# Patient Record
Sex: Male | Born: 1965 | Race: White | Hispanic: No | Marital: Married | Smoking: Never smoker
Health system: Southern US, Community
[De-identification: ages and names within clinical notes are randomized; demographics above are authoritative.]

## PROBLEM LIST (undated history)

## (undated) DIAGNOSIS — K219 Gastro-esophageal reflux disease without esophagitis: Secondary | ICD-10-CM

## (undated) DIAGNOSIS — I10 Essential (primary) hypertension: Secondary | ICD-10-CM

## (undated) DIAGNOSIS — H8109 Meniere's disease, unspecified ear: Secondary | ICD-10-CM

## (undated) DIAGNOSIS — E119 Type 2 diabetes mellitus without complications: Secondary | ICD-10-CM

## (undated) DIAGNOSIS — M109 Gout, unspecified: Secondary | ICD-10-CM

## (undated) DIAGNOSIS — R42 Dizziness and giddiness: Secondary | ICD-10-CM

## (undated) HISTORY — PX: TONSILLECTOMY: SUR1361

## (undated) HISTORY — PX: SHOULDER ARTHROSCOPY: SHX128

## (undated) HISTORY — DX: Meniere's disease, unspecified ear: H81.09

## (undated) HISTORY — DX: Dizziness and giddiness: R42

## (undated) HISTORY — PX: CHOLECYSTECTOMY: SHX55

---

## 2005-04-05 ENCOUNTER — Ambulatory Visit (HOSPITAL_COMMUNITY): Admission: RE | Admit: 2005-04-05 | Discharge: 2005-04-05 | Payer: Self-pay | Admitting: Otolaryngology

## 2009-12-26 ENCOUNTER — Encounter: Admission: RE | Admit: 2009-12-26 | Discharge: 2009-12-26 | Payer: Self-pay | Admitting: Orthopedic Surgery

## 2010-12-13 ENCOUNTER — Encounter: Payer: Self-pay | Admitting: Otolaryngology

## 2011-01-31 ENCOUNTER — Observation Stay (HOSPITAL_COMMUNITY)
Admission: EM | Admit: 2011-01-31 | Discharge: 2011-02-01 | Disposition: A | Discharge status: Home / Self Care | Payer: 59 | Attending: Emergency Medicine | Admitting: Emergency Medicine

## 2011-01-31 ENCOUNTER — Emergency Department (HOSPITAL_COMMUNITY): Payer: 59

## 2011-01-31 DIAGNOSIS — R079 Chest pain, unspecified: Principal | ICD-10-CM | POA: Insufficient documentation

## 2011-01-31 LAB — COMPREHENSIVE METABOLIC PANEL
ALT: 20 U/L (ref 0–53)
Albumin: 4 g/dL (ref 3.5–5.2)
BUN: 15 mg/dL (ref 6–23)
GFR calc Af Amer: >60 mL/min (ref >60)
GFR calc non Af Amer: >60 mL/min (ref >60)
Sodium: 140 mEq/L (ref 135–145)
Total Protein: 7.1 g/dL (ref 6.0–8.3)

## 2011-01-31 LAB — POCT CARDIAC MARKERS
CKMB, poc: 1.1 ng/mL (ref 1.0–8.0)
Myoglobin, poc: 94.3 ng/mL (ref 12–200)
Myoglobin, poc: 78.6 ng/mL (ref 12–200)
Troponin i, poc: <0.05 ng/mL (ref 0.00–0.09)
Troponin i, poc: <0.05 ng/mL (ref 0.00–0.09)

## 2011-01-31 LAB — CK TOTAL AND CKMB (NOT AT ARMC)
CK, MB: 2.4 ng/mL (ref 0.3–4.0)
Total CK: 173 U/L (ref 7–232)

## 2011-01-31 LAB — DIFFERENTIAL
Basophils Absolute: 0 10*3/uL (ref 0.0–0.1)
Basophils Relative: 0 % (ref 0–1)
Eosinophils Absolute: 0.2 10*3/uL (ref 0.0–0.7)
Eosinophils Relative: 3 % (ref 0–5)
Monocytes Relative: 10 % (ref 3–12)
Neutrophils Relative %: 53 % (ref 43–77)

## 2011-01-31 LAB — CBC
MCH: 31.6 pg (ref 26.0–34.0)
RDW: 13 % (ref 11.5–15.5)
WBC: 7.3 10*3/uL (ref 4.0–10.5)

## 2011-02-01 DIAGNOSIS — R072 Precordial pain: Secondary | ICD-10-CM

## 2011-03-11 ENCOUNTER — Ambulatory Visit (INDEPENDENT_AMBULATORY_CARE_PROVIDER_SITE_OTHER): Payer: 59 | Admitting: Internal Medicine

## 2011-03-11 DIAGNOSIS — K219 Gastro-esophageal reflux disease without esophagitis: Secondary | ICD-10-CM

## 2011-03-28 NOTE — Consult Note (Signed)
  NAMEYOHANNES, Steve Bernard                  ACCOUNT NO.:  [id number]  MEDICAL RECORD NO.:  [id number]           PATIENT TYPE: AMB.  LOCATION: [place].                   [facility]  PHYSICIAN:  Lionel December, M.D.    DATE OF BIRTH:  [birth year]  DATE :  03/11/2011                                 CONSULTATION   REASON FOR CONSULTATION: Poorly controlled GERD.  HISTORY OF PRESENT ILLNESS:  Steve Bernard is a 45 year old male stating that he has had acid reflux for 8-9 years.  His acid reflux is not controlled completely at this time with the Nexium 40 mg a day.  He has been on either Prilosec or Nexium for the past 8-9 years.  He states approximately 9 or 10 years ago he underwent a cholecystectomy by Dr. Cleotis Nipper.  He says when they intubated him, Dr. Cleotis Nipper was noted to say that his esophagus was very inflamed.  He says his appetite is good. There has been no dysphagia.  He has had a cardiac workup in the last month for chest pain which was negative.  Actually, he underwent an echo stress test which was negative.  His appetite is good.  He has had no dysphagia.  There has been no weight loss, fatigue, or fever.  There are no known allergies.  HOME MEDICATIONS: 1. Nexium 40 mg a day. 2. Metoprolol 25 mg twice a day. 3. MVI 1 a day. 4. Fish oil 1 a day. 5. Red yeast rice 1 a day.  SURGERIES:  He has had a cholecystectomy and a tonsillectomy.  MEDICAL:  He has hypertension, high cholesterol and GERD.  His mother deceased from type 1 diabetes in her 39s.  Father is alive with aortic valve replacement, doing pretty good.  One sister that was a stillborn.  Social, he is married.  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  He does not smoke, drink or do drugs and he has 1 child that is adopted.  OBJECTIVE:  VITAL SIGNS:  His weight is 282, height 5 feet 11 inches, temperature is 98.5, blood pressure is 138/78, pulse is 68. HEENT:  He has natural teeth.  His oral mucosa is  moist.  There are no lesions.  His conjunctivae are pink.  His sclerae are anicteric. NECK:  His thyroid is normal.  There is no cervical lymphadenopathy. LUNGS:  Clear. HEART:  Regular rate and rhythm. ABDOMEN:  Obese.  Bowel sounds are positive.  No masses.  ASSESSMENT:  Steve Bernard is a 45 year old male presenting with symptoms of GERD which are not controlled at this time.  Peptic ulcer disease needs to be ruled out as well as erosive esophagitis.  RECOMMENDATIONS:  We will schedule an EGD in the near future with Dr. Karilyn Cota and the risk and benefits were reviewed with the patient and he is agreeable.    ______________________________ Dorene Ar, NP   ______________________________ Lionel December, M.D.    TS/MEDQ  D:  03/11/2011  T:  03/12/2011  Job:  [id number]  Electronically Signed by Dorene Ar PA on 03/19/2011 10:41:00 AM Electronically Signed by Lionel December M.D. on 03/28/2011 09:43:42 PM

## 2011-04-15 ENCOUNTER — Encounter (INDEPENDENT_AMBULATORY_CARE_PROVIDER_SITE_OTHER): Payer: 59 | Admitting: Internal Medicine

## 2011-04-15 ENCOUNTER — Ambulatory Visit (HOSPITAL_COMMUNITY): Admission: RE | Admit: 2011-04-15 | Payer: 59 | Source: Ambulatory Visit | Admitting: Internal Medicine

## 2011-05-27 ENCOUNTER — Ambulatory Visit (HOSPITAL_COMMUNITY)
Admission: RE | Admit: 2011-05-27 | Discharge: 2011-05-27 | Disposition: A | Discharge status: Home / Self Care | Payer: 59 | Source: Ambulatory Visit | Attending: Internal Medicine | Admitting: Internal Medicine

## 2011-05-27 ENCOUNTER — Encounter (HOSPITAL_BASED_OUTPATIENT_CLINIC_OR_DEPARTMENT_OTHER): Payer: 59 | Admitting: Internal Medicine

## 2011-05-27 DIAGNOSIS — K219 Gastro-esophageal reflux disease without esophagitis: Secondary | ICD-10-CM

## 2011-05-27 DIAGNOSIS — Z79899 Other long term (current) drug therapy: Secondary | ICD-10-CM | POA: Insufficient documentation

## 2011-05-27 DIAGNOSIS — R0789 Other chest pain: Secondary | ICD-10-CM | POA: Insufficient documentation

## 2011-05-27 DIAGNOSIS — I10 Essential (primary) hypertension: Secondary | ICD-10-CM | POA: Insufficient documentation

## 2011-05-27 DIAGNOSIS — R1013 Epigastric pain: Secondary | ICD-10-CM | POA: Insufficient documentation

## 2011-06-14 NOTE — Op Note (Signed)
  [name], Steve Bernard NO.:  [id number]  MEDICAL RECORD NO.:  [id number]  LOCATION:  [place]                          FACILITY:  [facility]  PHYSICIAN:  Lionel December, M.D.    DATE OF BIRTH:  Sep 08, 1966  DATE OF PROCEDURE:  05/27/2011 DATE OF DISCHARGE:                              OPERATIVE REPORT   PROCEDURE:  Esophagogastroduodenoscopy.  INDICATION:  Steve Bernard is 45 year old Caucasian male who has been experiencing recurrent chest pain.  His noninvasive cardiac evaluation was negative.  He has symptoms of GERD for more than 10 years.  He states that since he was switched from Zegerid to Nexium, he is doing much better.  His EGD had to be postponed because of his father's severe illness.  He is undergoing EGD also to make sure he does not have complicated GERD or Barrett's esophagus.  Procedure and risks were reviewed with the patient and informed consent was obtained.  MEDS FOR CONSCIOUS SEDATION:  Cetacaine spray for pharyngeal topical anesthesia, Demerol 50 mg IV, Versed 6 mg IV.  FINDINGS:  Procedure performed in endoscopy suite.  The patient's vital signs and O2 sat were monitored during the procedure and remained stable.  The patient was placed in left lateral recumbent position and Pentax videoscope was passed through oropharynx without any difficulty into esophagus.  Esophagus.  Mucosa of the esophagus was normal.  GE junction was wavy, located at 40 cm from the incisors, but no erosions or ulcers were noted.  Stomach.  It was empty and distended very well by insufflation.  Folds of proximal stomach are normal.  Examination of mucosa at body, antrum, pyloric channel, as well as angularis, fundus, and cardia was normal.  Duodenum.  Bulbar mucosa was normal.  Scope was passed to second part of duodenum where mucosa and folds were normal.  Endoscope was withdrawn.  The patient tolerated the procedure well.  FINAL DIAGNOSES: 1. Normal  esophagogastroduodenoscopy. 2. Since he is doing much better with different PPI, i.e., Nexium, we     will not proceed with further workup.  RECOMMENDATIONS: 1. He will continue antireflux measures and Nexium as before. 2. Should the chest pain recur, he will give [place] a call.          ______________________________ Lionel December, M.D.     NR/MEDQ  D:  05/27/2011  T:  05/27/2011  Job:  [id number]  cc:   Doreen Beam, MD Fax: [phone]  Electronically Signed by Lionel December M.D. on 06/14/2011 12:10:29 AM

## 2012-07-10 IMAGING — CR DG CHEST 1V PORT
1 series · 1 of 1 positions shown · non-contrast
Comparison: None

CLINICAL DATA: Chest pain

PORTABLE CHEST - 1 VIEW

[view not recorded]
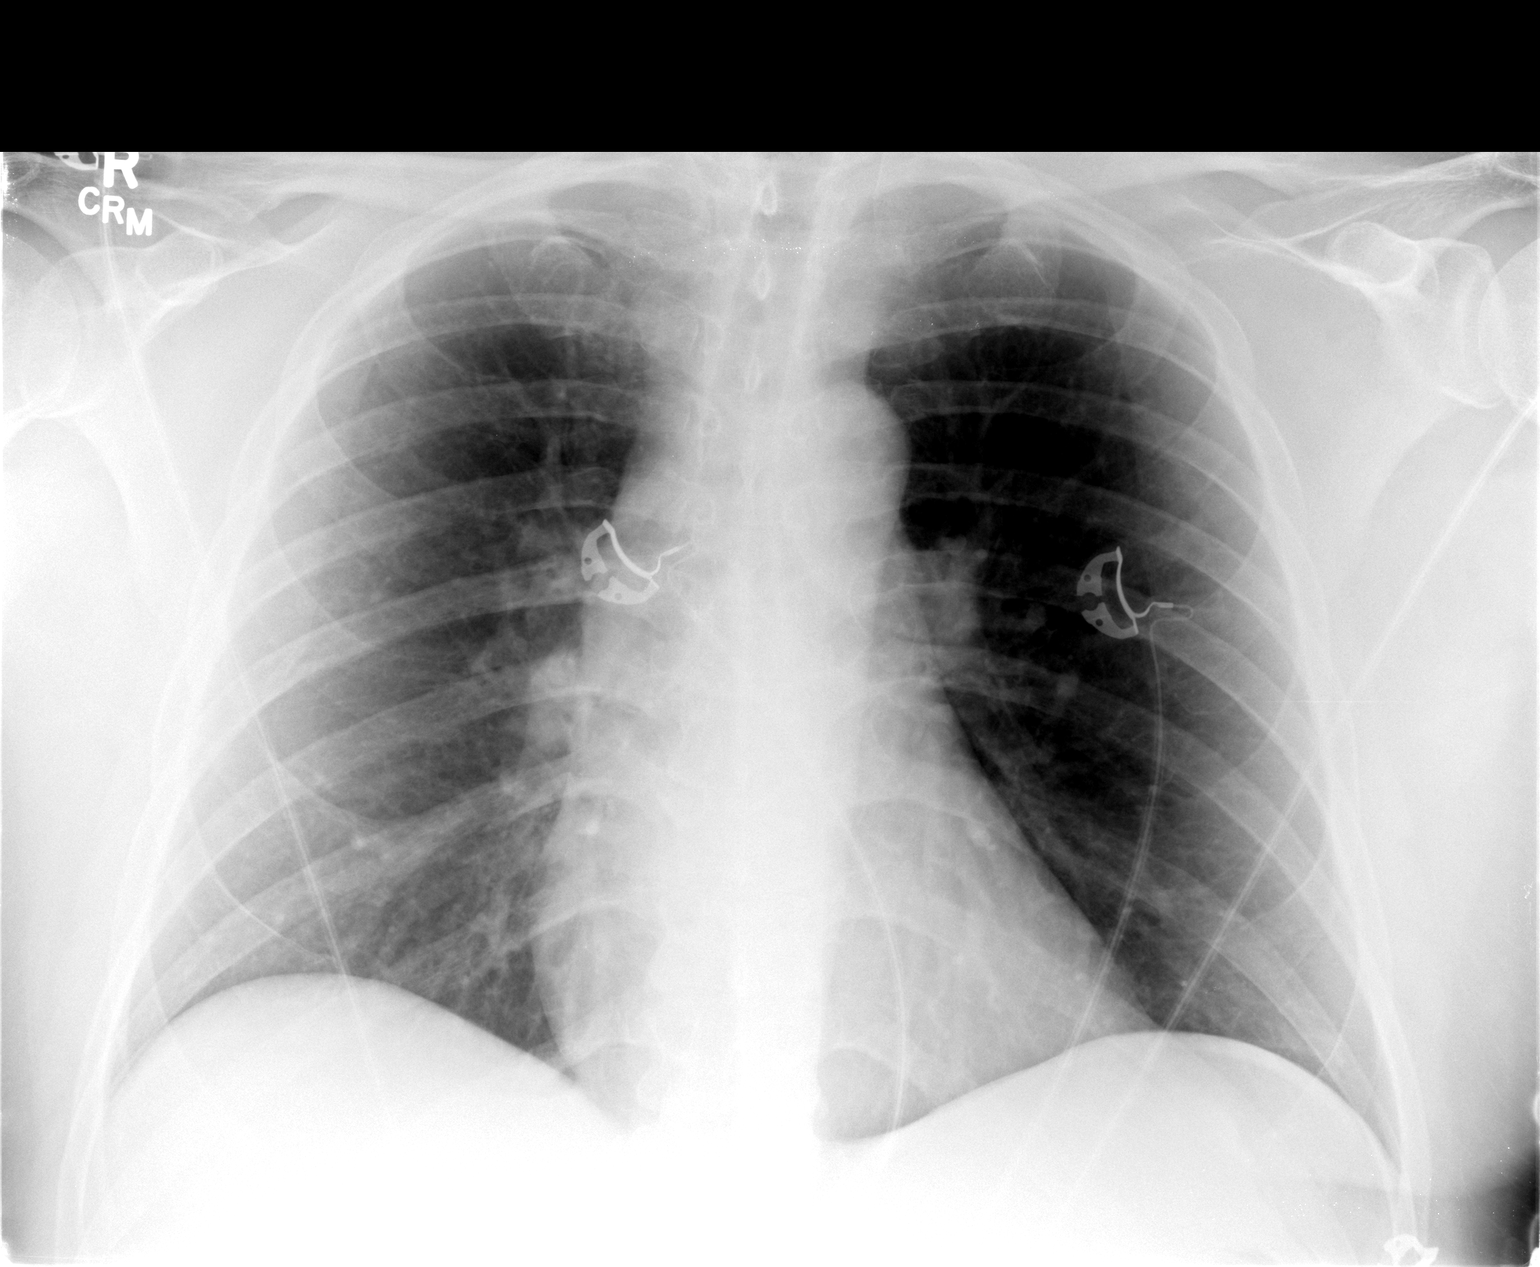

[1 of 1 positions shown; findings below may reference images not displayed]

FINDINGS: The cardiomediastinal silhouette is unremarkable.
The lungs are clear.
There is no evidence of focal airspace disease, pulmonary edema,
pulmonary nodule/mass, pleural effusion, or pneumothorax.
No acute bony abnormalities are identified.
IMPRESSION: No evidence of active cardiopulmonary disease.

## 2013-01-11 ENCOUNTER — Encounter (INDEPENDENT_AMBULATORY_CARE_PROVIDER_SITE_OTHER): Payer: Self-pay | Admitting: *Deleted

## 2013-01-17 ENCOUNTER — Telehealth (INDEPENDENT_AMBULATORY_CARE_PROVIDER_SITE_OTHER): Payer: Self-pay | Admitting: *Deleted

## 2013-01-17 ENCOUNTER — Other Ambulatory Visit (INDEPENDENT_AMBULATORY_CARE_PROVIDER_SITE_OTHER): Payer: Self-pay | Admitting: *Deleted

## 2013-01-17 ENCOUNTER — Encounter (INDEPENDENT_AMBULATORY_CARE_PROVIDER_SITE_OTHER): Payer: Self-pay | Admitting: *Deleted

## 2013-01-17 DIAGNOSIS — Z1211 Encounter for screening for malignant neoplasm of colon: Secondary | ICD-10-CM

## 2013-01-17 NOTE — Telephone Encounter (Signed)
Patient needs movi prep 

## 2013-01-18 ENCOUNTER — Encounter (INDEPENDENT_AMBULATORY_CARE_PROVIDER_SITE_OTHER): Payer: Self-pay | Admitting: *Deleted

## 2013-01-18 MED ORDER — PEG-KCL-NACL-NASULF-NA ASC-C 100 G PO SOLR: 1.0000 | Freq: Once | ORAL | Status: DC

## 2013-02-19 ENCOUNTER — Telehealth (INDEPENDENT_AMBULATORY_CARE_PROVIDER_SITE_OTHER): Payer: Self-pay | Admitting: *Deleted

## 2013-02-19 NOTE — Telephone Encounter (Signed)
  Procedure: tcs  Reason/Indication:  screening  Has patient had this procedure before?  no  If so, when, by whom and where?    Is there a family history of colon cancer?  Not sure  Who?  What age when diagnosed?    Is patient diabetic?   no      Does patient have prosthetic heart valve?  no  Do you have a pacemaker?  no  Has patient ever had endocarditis?   Has patient had joint replacement within last 12 months?  no  Is patient on Coumadin, Plavix and/or Aspirin? no  Medications: allopurinol 300 mg daily, nexium 40 mg daily, metoprolol 50 mg daily  Allergies: nkda  Medication Adjustment:   Procedure date & time: 03/21/13 at 730

## 2013-02-20 NOTE — Telephone Encounter (Signed)
May need to ask Dr. Karilyn Cota concerning this one. Patient is 32. Is he having any problems? Is there a family hx of colon cancer

## 2013-02-22 NOTE — Telephone Encounter (Signed)
This patient is sch'd for TCS 03/21/13, referral from Dr Sherril Croon -- Camelia Eng wanted me to ask you if it's ok to proceed with TCS since patient is only 81, patient wasn't sure if he has family hx of colon ca -- please advise

## 2013-02-25 NOTE — Telephone Encounter (Signed)
Need more information; If he is [place] American it would be reasonable to start screening prior to age 47.

## 2013-02-26 NOTE — Telephone Encounter (Signed)
I spoke to Steve Bernard, he does not have a family hx of colon ca and the only issue he is having is once or twice a month his stool will be "skinny" and sometimes he has a hard time having a bowel movement.  Also, when he had his physical he stated Dr Sherril Croon had a hard time checking his prostate so Dr Sherril Croon felt he needed to go ahead with TCS. I told him I would run this by you and see what your recommendation would be.. -- please advise

## 2013-03-05 NOTE — Telephone Encounter (Signed)
lmom advising patient, ok to proceed with TCS

## 2013-03-05 NOTE — Telephone Encounter (Signed)
Can proceed with colonoscopy for change in bowel habits

## 2013-03-19 ENCOUNTER — Encounter (HOSPITAL_COMMUNITY): Payer: Self-pay | Admitting: Pharmacy Technician

## 2013-03-21 ENCOUNTER — Encounter (HOSPITAL_COMMUNITY)
Admission: RE | Disposition: A | Discharge status: Home / Self Care | Payer: Self-pay | Source: Ambulatory Visit | Attending: Internal Medicine

## 2013-03-21 ENCOUNTER — Ambulatory Visit (HOSPITAL_COMMUNITY)
Admission: RE | Admit: 2013-03-21 | Discharge: 2013-03-21 | Disposition: A | Discharge status: Home / Self Care | Payer: BC Managed Care – PPO | Source: Ambulatory Visit | Attending: Internal Medicine | Admitting: Internal Medicine

## 2013-03-21 ENCOUNTER — Encounter (HOSPITAL_COMMUNITY): Payer: Self-pay | Admitting: *Deleted

## 2013-03-21 DIAGNOSIS — I10 Essential (primary) hypertension: Secondary | ICD-10-CM | POA: Insufficient documentation

## 2013-03-21 DIAGNOSIS — D126 Benign neoplasm of colon, unspecified: Secondary | ICD-10-CM

## 2013-03-21 DIAGNOSIS — R198 Other specified symptoms and signs involving the digestive system and abdomen: Secondary | ICD-10-CM

## 2013-03-21 DIAGNOSIS — Z1211 Encounter for screening for malignant neoplasm of colon: Secondary | ICD-10-CM

## 2013-03-21 HISTORY — DX: Gout, unspecified: M10.9

## 2013-03-21 HISTORY — DX: Gastro-esophageal reflux disease without esophagitis: K21.9

## 2013-03-21 HISTORY — DX: Essential (primary) hypertension: I10

## 2013-03-21 HISTORY — PX: COLONOSCOPY: SHX5424

## 2013-03-21 SURGERY — COLONOSCOPY
Anesthesia: Moderate Sedation

## 2013-03-21 MED ORDER — SODIUM CHLORIDE 0.45 % IV SOLN
INTRAVENOUS | Status: DC
  Administered 2013-03-21: 07:00:00 via INTRAVENOUS

## 2013-03-21 MED ORDER — MEPERIDINE HCL 50 MG/ML IJ SOLN
INTRAMUSCULAR | Status: AC
  Filled 2013-03-21: qty 1

## 2013-03-21 MED ORDER — MEPERIDINE HCL 50 MG/ML IJ SOLN
INTRAMUSCULAR | Status: DC | PRN
  Administered 2013-03-21 (×2): 25 mg via INTRAVENOUS

## 2013-03-21 MED ORDER — STERILE WATER FOR IRRIGATION IR SOLN
Status: DC | PRN
  Administered 2013-03-21: 08:00:00

## 2013-03-21 MED ORDER — MIDAZOLAM HCL 5 MG/5ML IJ SOLN
INTRAMUSCULAR | Status: DC | PRN
  Administered 2013-03-21 (×2): 2 mg via INTRAVENOUS
  Administered 2013-03-21: 1 mg via INTRAVENOUS

## 2013-03-21 MED ORDER — MIDAZOLAM HCL 5 MG/5ML IJ SOLN
INTRAMUSCULAR | Status: AC
  Filled 2013-03-21: qty 10

## 2013-03-21 NOTE — H&P (Signed)
Steve Bernard is an 47 y.o. male.   Chief Complaint: Patient is here for colonoscopy. HPI: Patient is 47 year old Caucasian male who is in for diagnostic colonoscopy. He has noted change in his bowel habits over the last few months. Caliber has decreased. He denies abdominal pain rectal bleeding anorexia or weight loss. Family history is negative for colorectal carcinoma.  Past Medical History  Diagnosis Date  . GERD (gastroesophageal reflux disease)   . Hypertension   . Gout     Past Surgical History  Procedure Laterality Date  . Cholecystectomy    . Tonsillectomy      History reviewed. No pertinent family history. Social History:  reports that he has never smoked. He does not have any smokeless tobacco history on file. He reports that he does not drink alcohol or use illicit drugs.  Allergies: No Known Allergies  Medications Prior to Admission  Medication Sig Dispense Refill  . allopurinol (ZYLOPRIM) 300 MG tablet Take 300 mg by mouth daily.      Marland Kitchen esomeprazole (NEXIUM) 40 MG capsule Take 40 mg by mouth daily before breakfast.      . fluticasone (FLONASE) 50 MCG/ACT nasal spray Place 2 sprays into the nose daily.      . metoprolol (LOPRESSOR) 50 MG tablet Take 50 mg by mouth daily.      . Misc Natural Products (BLACK CHERRY CONCENTRATE PO) Take 1 tablet by mouth daily.      . naphazoline-pheniramine (NAPHCON-A) 0.025-0.3 % ophthalmic solution Place 1 drop into both eyes 4 (four) times daily as needed (Itchy Eyes).      . peg 3350 powder (MOVIPREP) 100 G SOLR Take 1 kit (100 g total) by mouth once.  1 kit  0    No results found for this or any previous visit (from the past 48 hour(s)). No results found.  ROS  Blood pressure 132/88, pulse 63, temperature 97.9 F (36.6 C), temperature source Oral, resp. rate 18, height 5\' 11"  (1.803 m), weight 260 lb (117.935 kg), SpO2 97.00%. Physical Exam  Constitutional: He appears well-developed and well-nourished.  HENT:  Mouth/Throat:  Oropharynx is clear and moist.  Eyes: Conjunctivae are normal. No scleral icterus.  Neck: No thyromegaly present.  Cardiovascular: Normal rate, regular rhythm and normal heart sounds.   No murmur heard. Respiratory: Effort normal and breath sounds normal.  GI: Soft. He exhibits no distension and no mass. There is no tenderness.  Musculoskeletal: He exhibits no edema.  Lymphadenopathy:    He has no cervical adenopathy.  Neurological: He is alert.  Skin: Skin is warm and dry.     Assessment/Plan Change in bowel habits. Diagnostic colonoscopy.  Steve Bernard U 03/21/2013, 7:37 AM

## 2013-03-21 NOTE — Op Note (Signed)
COLONOSCOPY PROCEDURE REPORT  PATIENT:  Steve Bernard  MR#:  [id number] Birthdate:  Nov 30, 1965, 47 y.o., male Endoscopist:  Dr. Malissa Hippo, MD Referred By:  Dr. Ignatius Specking, MD Procedure Date: 03/21/2013  Procedure:   Colonoscopy with snare polypectomy.  Indications: Patient is 47 year old Caucasian male who has noted change in his bowel habits recently. Family history is negative for CRC.  Informed Consent:  The procedure and risks were reviewed with the patient and informed consent was obtained.  Medications:  Demerol 50 mg IV Versedmg IV  Description of procedure:  After a digital rectal exam was performed, that colonoscope was advanced from the anus through the rectum and colon to the area of the cecum, ileocecal valve and appendiceal orifice. The cecum was deeply intubated. These structures were well-seen and photographed for the record. From the level of the cecum and ileocecal valve, the scope was slowly and cautiously withdrawn. The mucosal surfaces were carefully surveyed utilizing scope tip to flexion to facilitate fold flattening as needed. The scope was pulled down into the rectum where a thorough exam including retroflexion was performed. Terminal ileum was also examined.  Findings:   Prep satisfactory. Normal mucosa of terminal ileum. Polyp at proximal transverse colon was ablated via cold biopsy and submitted together with a small polyp that was all stated from distal sigmoid colon. 7 mm polyp snared from distal sigmoid colon another 5 mm polyp snared from rectosigmoid junction. These polyps were cemented together. Normal rectal mucosa and anal rectal junction.   Therapeutic/Diagnostic Maneuvers Performed:  See above  Complications:  None  Cecal Withdrawal Time:  19 minutes  Impression:  Normal mucosa of terminal ileum. Two small polyps submitted together(1 from transverse colon removed via cold biopsy and the second one cold snared from distal sigmoid  colon). Two polyps(7 and 5 mm each) were snared and submitted together(sigmoid colon and rectosigmoid junction).  Recommendations:  Standard instructions given. I will contact patient with biopsy results and further recommendations.  Ramar Nobrega U  03/21/2013 8:22 AM  CC: Dr. Ignatius Specking., MD & Dr. Bonnetta Barry ref. provider found

## 2013-03-22 ENCOUNTER — Encounter (HOSPITAL_COMMUNITY): Payer: Self-pay | Admitting: Internal Medicine

## 2013-03-26 ENCOUNTER — Encounter (INDEPENDENT_AMBULATORY_CARE_PROVIDER_SITE_OTHER): Payer: Self-pay | Admitting: *Deleted

## 2015-03-23 HISTORY — PX: ANTERIOR FUSION CERVICAL SPINE: SUR626

## 2015-11-23 DIAGNOSIS — H8109 Meniere's disease, unspecified ear: Secondary | ICD-10-CM

## 2015-11-23 DIAGNOSIS — R42 Dizziness and giddiness: Secondary | ICD-10-CM

## 2015-11-23 HISTORY — DX: Meniere's disease, unspecified ear: H81.09

## 2015-11-23 HISTORY — DX: Dizziness and giddiness: R42

## 2016-04-01 ENCOUNTER — Other Ambulatory Visit (HOSPITAL_COMMUNITY): Payer: Self-pay | Admitting: Otolaryngology

## 2016-04-01 DIAGNOSIS — H9311 Tinnitus, right ear: Secondary | ICD-10-CM

## 2016-04-01 DIAGNOSIS — IMO0001 Reserved for inherently not codable concepts without codable children: Secondary | ICD-10-CM

## 2016-04-01 DIAGNOSIS — H903 Sensorineural hearing loss, bilateral: Secondary | ICD-10-CM

## 2016-04-05 ENCOUNTER — Ambulatory Visit (HOSPITAL_COMMUNITY)
Admission: RE | Admit: 2016-04-05 | Discharge: 2016-04-05 | Disposition: A | Discharge status: Home / Self Care | Payer: BLUE CROSS/BLUE SHIELD | Source: Ambulatory Visit | Attending: Otolaryngology | Admitting: Otolaryngology

## 2016-04-05 DIAGNOSIS — H903 Sensorineural hearing loss, bilateral: Secondary | ICD-10-CM

## 2016-04-05 DIAGNOSIS — H9191 Unspecified hearing loss, right ear: Secondary | ICD-10-CM | POA: Diagnosis not present

## 2016-04-05 DIAGNOSIS — R42 Dizziness and giddiness: Secondary | ICD-10-CM | POA: Diagnosis not present

## 2016-04-05 DIAGNOSIS — R9089 Other abnormal findings on diagnostic imaging of central nervous system: Secondary | ICD-10-CM | POA: Insufficient documentation

## 2016-04-05 DIAGNOSIS — IMO0001 Reserved for inherently not codable concepts without codable children: Secondary | ICD-10-CM

## 2016-04-05 DIAGNOSIS — H9311 Tinnitus, right ear: Secondary | ICD-10-CM

## 2016-04-05 DIAGNOSIS — J392 Other diseases of pharynx: Secondary | ICD-10-CM | POA: Diagnosis not present

## 2016-04-05 LAB — POCT I-STAT CREATININE: Creatinine, Ser: 0.9 mg/dL (ref 0.61–1.24)

## 2016-04-05 MED ORDER — GADOBENATE DIMEGLUMINE 529 MG/ML IV SOLN
20.0000 mL | Freq: Once | INTRAVENOUS | Status: AC | PRN
  Administered 2016-04-05: 20 mL via INTRAVENOUS

## 2016-11-03 DIAGNOSIS — M19042 Primary osteoarthritis, left hand: Principal | ICD-10-CM

## 2016-11-03 DIAGNOSIS — M19041 Primary osteoarthritis, right hand: Secondary | ICD-10-CM | POA: Insufficient documentation

## 2016-11-03 DIAGNOSIS — M1A00X0 Idiopathic chronic gout, unspecified site, without tophus (tophi): Secondary | ICD-10-CM | POA: Insufficient documentation

## 2016-11-03 DIAGNOSIS — Z8669 Personal history of other diseases of the nervous system and sense organs: Secondary | ICD-10-CM | POA: Insufficient documentation

## 2016-11-03 NOTE — Progress Notes (Deleted)
   Office Visit Note  Patient: Steve Bernard             Date of Birth: January 24, 1966           MRN: [id number]             PCP: Glenda Chroman, MD Referring: Glenda Chroman, MD Visit Date: 11/05/2016 Occupation: @GUAROCC @    Subjective:  No chief complaint on file.   History of Present Illness: SHYKEIM SOLE is a 50 y.o. male ***   Activities of Daily Living:  Patient reports morning stiffness for *** {minute/hour:19697}.   Patient {ACTIONS;DENIES/REPORTS:21021675::"Denies"} nocturnal pain.  Difficulty dressing/grooming: {ACTIONS;DENIES/REPORTS:21021675::"Denies"} Difficulty climbing stairs: {ACTIONS;DENIES/REPORTS:21021675::"Denies"} Difficulty getting out of chair: {ACTIONS;DENIES/REPORTS:21021675::"Denies"} Difficulty using hands for taps, buttons, cutlery, and/or writing: {ACTIONS;DENIES/REPORTS:21021675::"Denies"}   No Rheumatology ROS completed.   PMFS History:  Patient Active Problem List   Diagnosis Date Noted  . Idiopathic chronic gout, unspecified site, without tophus (tophi) 11/03/2016  . Primary osteoarthritis of both hands 11/03/2016  . History of Meniere's disease 11/03/2016    Past Medical History:  Diagnosis Date  . GERD (gastroesophageal reflux disease)   . Gout   . Hypertension     No family history on file. Past Surgical History:  Procedure Laterality Date  . CHOLECYSTECTOMY    . COLONOSCOPY N/A 03/21/2013   Procedure: COLONOSCOPY;  Surgeon: Rogene Houston, MD;  Location: [facility];  Service: Endoscopy;  Laterality: N/A;  955-rescheduled to 7:30am Ann notified pt  . TONSILLECTOMY     Social History   Social History Narrative  . No narrative on file     Objective: Vital Signs: There were no vitals taken for this visit.   Physical Exam   Musculoskeletal Exam: ***  CDAI Exam: No CDAI exam completed.    Investigation: Findings:  06/21/2016 ANA with titer, sed rate, rheumatoid factor, CCP and P-ANCA CBC with diff normal CMP normal except  for elevated non fasting glucose 160     Imaging: No results found.  Speciality Comments: No specialty comments available.    Procedures:  No procedures performed Allergies: Patient has no known allergies.   Assessment / Plan:     Visit Diagnoses: Primary osteoarthritis of both hands  Idiopathic chronic gout of multiple sites without tophus  History of Meniere's disease    Orders: No orders of the defined types were placed in this encounter.  No orders of the defined types were placed in this encounter.   Face-to-face time spent with patient was *** minutes. 50% of time was spent in counseling and coordination of care.  Follow-Up Instructions: No Follow-up on file.   Amy Littrell, RT

## 2016-11-05 ENCOUNTER — Ambulatory Visit (INDEPENDENT_AMBULATORY_CARE_PROVIDER_SITE_OTHER): Payer: Self-pay

## 2016-11-05 ENCOUNTER — Ambulatory Visit (INDEPENDENT_AMBULATORY_CARE_PROVIDER_SITE_OTHER): Payer: BLUE CROSS/BLUE SHIELD | Admitting: Rheumatology

## 2016-11-05 ENCOUNTER — Encounter: Payer: Self-pay | Admitting: Rheumatology

## 2016-11-05 VITALS — BP 141/81 | HR 66 | Resp 14 | Ht 71.0 in | Wt 293.0 lb

## 2016-11-05 DIAGNOSIS — M19041 Primary osteoarthritis, right hand: Secondary | ICD-10-CM

## 2016-11-05 DIAGNOSIS — Z8669 Personal history of other diseases of the nervous system and sense organs: Secondary | ICD-10-CM | POA: Diagnosis not present

## 2016-11-05 DIAGNOSIS — M1A09X0 Idiopathic chronic gout, multiple sites, without tophus (tophi): Secondary | ICD-10-CM

## 2016-11-05 DIAGNOSIS — M79672 Pain in left foot: Secondary | ICD-10-CM

## 2016-11-05 DIAGNOSIS — M722 Plantar fascial fibromatosis: Secondary | ICD-10-CM | POA: Diagnosis not present

## 2016-11-05 DIAGNOSIS — M19042 Primary osteoarthritis, left hand: Secondary | ICD-10-CM

## 2016-11-05 DIAGNOSIS — M79671 Pain in right foot: Secondary | ICD-10-CM | POA: Diagnosis not present

## 2016-11-05 MED ORDER — LIDOCAINE HCL 1 % IJ SOLN
0.5000 mL | INTRAMUSCULAR | Status: AC | PRN
  Administered 2016-11-05: .5 mL

## 2016-11-05 MED ORDER — TRIAMCINOLONE ACETONIDE 40 MG/ML IJ SUSP
20.0000 mg | INTRAMUSCULAR | Status: AC | PRN
Start: 2016-11-05 — End: 2016-11-05
  Administered 2016-11-05: 20 mg

## 2016-11-05 MED ORDER — ALLOPURINOL 300 MG PO TABS: 300.0000 mg | ORAL_TABLET | Freq: Every day | ORAL | 1 refills | Status: DC

## 2016-11-05 NOTE — Progress Notes (Signed)
Office Visit Note  Patient: Steve Bernard             Date of Birth: Sep 07, 1966           MRN: [id number]             PCP: Glenda Chroman, MD Referring: Glenda Chroman, MD Visit Date: 11/05/2016 Occupation: @GUAROCC @    Subjective:  No chief complaint on file. Follow-up on gout and high-risk prescription and plantar fasciitis.  History of Present Illness: Steve Bernard is a 50 y.o. male   Last seen 06/21/2016 Patient is doing well with gout. No complaint. No flares. Using allopurinol 3 mg every day Has not had to use Indocin since he has not had any flares but has enough available. He would use 25 mg twice a day when necessary  His main complaint today is been having heel pain bilaterally with right worse than left. This is consistent with plantar fasciitis. He rates his pain as 10 on a scale of 0-10 especially in his right foot and he is unable to ambulate well secondary to this pain.    Activities of Daily Living:  Patient reports morning stiffness for 15 minutes.   Patient Reports nocturnal pain.  Difficulty dressing/grooming: Denies Difficulty climbing stairs: Reports Difficulty getting out of chair: Denies Difficulty using hands for taps, buttons, cutlery, and/or writing: Denies   Review of Systems  Constitutional: Negative for fatigue.  HENT: Negative for mouth sores and mouth dryness.   Eyes: Negative for dryness.  Respiratory: Negative for shortness of breath.   Gastrointestinal: Negative for constipation and diarrhea.  Musculoskeletal: Negative for myalgias and myalgias.  Skin: Negative for sensitivity to sunlight.  Neurological: Negative for memory loss.  Psychiatric/Behavioral: Negative for sleep disturbance.    PMFS History:  Patient Active Problem List   Diagnosis Date Noted  . Idiopathic chronic gout, unspecified site, without tophus (tophi) 11/03/2016  . Primary osteoarthritis of both hands 11/03/2016  . History of Meniere's disease 11/03/2016    Past  Medical History:  Diagnosis Date  . GERD (gastroesophageal reflux disease)   . Gout   . Hypertension   . Meniere disease 2017  . Vertigo 2017    Family History  Problem Relation Age of Onset  . Arthritis/Rheumatoid Mother   . Diabetes Mother   . Heart disease Father   . Arthritis/Rheumatoid Maternal Aunt    Past Surgical History:  Procedure Laterality Date  . ANTERIOR FUSION CERVICAL SPINE N/A 03/2015  . CHOLECYSTECTOMY    . COLONOSCOPY N/A 03/21/2013   Procedure: COLONOSCOPY;  Surgeon: Rogene Houston, MD;  Location: [facility];  Service: Endoscopy;  Laterality: N/A;  955-rescheduled to 7:30am Ann notified pt  . TONSILLECTOMY     Social History   Social History Narrative  . No narrative on file     Objective: Vital Signs: BP (!) 141/81 (BP Location: Left Arm, Patient Position: Sitting, Cuff Size: Normal)   Pulse 66   Resp 14   Ht 5\' 11"  (1.803 m)   Wt 293 lb (132.9 kg)   BMI 40.87 kg/m    Physical Exam  Constitutional: He is oriented to person, place, and time. He appears well-developed and well-nourished.  HENT:  Head: Normocephalic and atraumatic.  Eyes: Conjunctivae and EOM are normal. Pupils are equal, round, and reactive to light.  Neck: Normal range of motion. Neck supple.  Cardiovascular: Normal rate, regular rhythm and normal heart sounds.  Exam reveals no gallop and  no friction rub.   No murmur heard. Pulmonary/Chest: Effort normal and breath sounds normal. No respiratory distress. He has no wheezes. He has no rales. He exhibits no tenderness.  Abdominal: Soft. He exhibits no distension and no mass. There is no tenderness. There is no guarding.  Musculoskeletal: Normal range of motion.  Lymphadenopathy:    He has no cervical adenopathy.  Neurological: He is alert and oriented to person, place, and time. He exhibits normal muscle tone. Coordination normal.  Skin: Skin is warm and dry. Capillary refill takes less than 2 seconds. No rash noted.    Psychiatric: He has a normal mood and affect. His behavior is normal. Judgment and thought content normal.  Vitals reviewed.    Musculoskeletal Exam:  Full range of motion of all joints Grip strength is equal and strong bilaterally Fiber myalgia tender points are all absent  CDAI Exam: No CDAI exam completed.  No synovitis on examination  Investigation: No additional findings. Patient had autoimmune workup back in 06/21/2016. This was reviewed with the patient  06/21/2016 ANA with titer, sed rate, rheumatoid factor, CCP and P-ANCA CBC with diff normal CMP normal except for elevated non fasting glucose 160   Imaging: No results found.  Speciality Comments: No specialty comments available.    Procedures:  Foot Inj Date/Time: 11/05/2016 3:32 PM Performed by: Bo Merino Authorized by: Eliezer Lofts   Consent Given by:  Patient Site marked: the procedure site was marked   Timeout: prior to procedure the correct patient, procedure, and site was verified   Indications:  Fasciitis Condition: Plantar Fasciitis   Location: right plantar fascia muscle   Needle Size:  27 G Approach:  Anterior Medications:  0.5 mL lidocaine 1 %; 20 mg triamcinolone acetonide 40 MG/ML  Patient was having severe pain in the right heel of his foot for approximately one month or so. He rates the pain as 10 on a scale of 0-10. Both feet are hurting but the right is worse than left. I attempted to give him the injection and patient jerked his foot when the needle was inserted into his foot. 9 I had asked his wife to hold him still and grip the foot/ankle but she allow the patient to let go because of the force that he used.  There no injuries except the needle had come out and so I asked Dr. Estanislado Pandy to do the injection Y while I held the foot/ankle. I switched the whole 27-gauge needle since I had already used it and the injection failed to her brand-new 1. The site was prepped and cold  spray was used and Dr. Estanislado Pandy injected the patient in the right heel without difficulty. I covered the area with a Band-Aid and patient appreciated the injection and apologize for flinching/jerking his foot during the time of the injection.  Patient felt at least 25-50% better few minutes after the injection.   Allergies: Patient has no known allergies.   Assessment / Plan:     Visit Diagnoses: Primary osteoarthritis of both hands  Idiopathic chronic gout of multiple sites without tophus  History of Meniere's disease  Heel pain, bilateral - Plan: XR Foot 2 Views Right, XR Foot 2 Views Left  Plantar fasciitis, bilateral   He rates the pain as 10 on a scale of 0-10. Both feet are hurting but the right is worse than left. I attempted to give him the injection and patient jerked his foot when the needle was inserted into his foot. 9  I had asked his wife to hold him still and grip the foot/ankle but she allow the patient to let go because of the force that he used.  There no injuries except the needle had come out and so I asked Dr. Estanislado Pandy to do the injection Y while I held the foot/ankle. I switched the whole 27-gauge needle since I had already used it and the injection failed to her brand-new 1. The site was prepped and cold spray was used and Dr. Estanislado Pandy injected the patient in the right heel without difficulty. I covered the area with a Band-Aid and patient appreciated the injection and apologize for flinching/jerking his foot during the time of the injection.  Patient felt at least 25-50% better few minutes after the injection.  Plan: Refill allopurinol 300 mg daily, 90 day supply with a refill  X-ray of bilateral feet 2 views.  Return to clinic as scheduled  I advised patient to go to the shoe market on [address] for shoe inserts. Patient understands and is agreeable. I've given him a handout on plantar fasciitis exercises.  Give the proper gout diet as he already has  been and take the proper medication as he already has been as well as stay hydrated as he has been doing to maintain his gout control.  Orders: Orders Placed This Encounter  Procedures  . Foot Injection  . XR Foot 2 Views Right  . XR Foot 2 Views Left   Meds ordered this encounter  Medications  . allopurinol (ZYLOPRIM) 300 MG tablet    Sig: Take 1 tablet (300 mg total) by mouth daily.    Dispense:  90 tablet    Refill:  1    Order Specific Question:   Supervising Provider    Answer:   Bo Merino [phone]    Face-to-face time spent with patient was 30 minutes. 50% of time was spent in counseling and coordination of care.  Follow-Up Instructions: Return in about 4 months (around 03/06/2017).   Eliezer Lofts, PA-C

## 2016-11-05 NOTE — Patient Instructions (Addendum)
Plantar Fasciitis Plantar fasciitis is a painful foot condition that affects the heel. It occurs when the band of tissue that connects the toes to the heel bone (plantar fascia) becomes irritated. This can happen after exercising too much or doing other repetitive activities (overuse injury). The pain from plantar fasciitis can range from mild irritation to severe pain that makes it difficult for you to walk or move. The pain is usually worse in the morning or after you have been sitting or lying down for a while. CAUSES This condition may be caused by:  Standing for long periods of time.  Wearing shoes that do not fit.  Doing high-impact activities, including running, aerobics, and ballet.  Being overweight.  Having an abnormal way of walking (gait).  Having tight calf muscles.  Having high arches in your feet.  Starting a new athletic activity. SYMPTOMS The main symptom of this condition is heel pain. Other symptoms include:  Pain that gets worse after activity or exercise.  Pain that is worse in the morning or after resting.  Pain that goes away after you walk for a few minutes. DIAGNOSIS This condition may be diagnosed based on your signs and symptoms. Your health care provider will also do a physical exam to check for:  A tender area on the bottom of your foot.  A high arch in your foot.  Pain when you move your foot.  Difficulty moving your foot. You may also need to have imaging studies to confirm the diagnosis. These can include:  X-rays.  Ultrasound.  MRI. TREATMENT  Treatment for plantar fasciitis depends on the severity of the condition. Your treatment may include:  Rest, ice, and over-the-counter pain medicines to manage your pain.  Exercises to stretch your calves and your plantar fascia.  A splint that holds your foot in a stretched, upward position while you sleep (night splint).  Physical therapy to relieve symptoms and prevent problems in the  future.  Cortisone injections to relieve severe pain.  Extracorporeal shock wave therapy (ESWT) to stimulate damaged plantar fascia with electrical impulses. It is often used as a last resort before surgery.  Surgery, if other treatments have not worked after 12 months. HOME CARE INSTRUCTIONS  Take medicines only as directed by your health care provider.  Avoid activities that cause pain.  Roll the bottom of your foot over a bag of ice or a bottle of cold water. Do this for 20 minutes, 3-4 times a day.  Perform simple stretches as directed by your health care provider.  Try wearing athletic shoes with air-sole or gel-sole cushions or soft shoe inserts.  Wear a night splint while sleeping, if directed by your health care provider.  Keep all follow-up appointments with your health care provider. PREVENTION   Do not perform exercises or activities that cause heel pain.  Consider finding low-impact activities if you continue to have problems.  Lose weight if you need to. The best way to prevent plantar fasciitis is to avoid the activities that aggravate your plantar fascia. SEEK MEDICAL CARE IF:  Your symptoms do not go away after treatment with home care measures.  Your pain gets worse.  Your pain affects your ability to move or do your daily activities. This information is not intended to replace advice given to you by your health care provider. Make sure you discuss any questions you have with your health care provider. Document Released: 08/03/2001 Document Revised: 03/01/2016 Document Reviewed: 09/18/2014 Elsevier Interactive Patient Education    2017 Else[employer] Inc[employer]   ===================================== Gout Introduction Gout is painful swelling that can happen in some of your joints. Gout is a type of arthritis. This condition is caused by having too much uric acid in your body. Uric acid is a chemical that is made when your body breaks down substances called purines.  If your body has too much uric acid, sharp crystals can form and build up in your joints. This causes pain and swelling. Gout attacks can happen quickly and be very painful (acute gout). Over time, the attacks can affect more joints and happen more often (chronic gout). Follow these instructions at home: During a Gout Attack  If directed, put ice on the painful area:  Put ice in a plastic bag.  Place a towel between your skin and the bag.  Leave the ice on for 20 minutes, 2-3 times a day.  Rest the joint as much as possible. If the joint is in your leg, you may be given crutches to use.  Raise (elevate) the painful joint above the level of your heart as often as you can.  Drink enough fluids to keep your pee (urine) clear or pale yellow.  Take over-the-counter and prescription medicines only as told by your doctor.  Do not drive or use heavy machinery while taking prescription pain medicine.  Follow instructions from your doctor about what you can or cannot eat and drink.  Return to your normal activities as told by your doctor. Ask your doctor what activities are safe for you. Avoiding Future Gout Attacks  Follow a low-purine diet as told by a specialist (dietitian) or your doctor. Avoid foods and drinks that have a lot of purines, such as:  Liver.  Kidney.  Anchovies.  Asparagus.  Herring.  Mushrooms  Mussels.  Beer.  Limit alcohol intake to no more than 1 drink a day for nonpregnant women and 2 drinks a day for men. One drink equals 12 oz of beer, 5 oz of wine, or 1 oz of hard liquor.  Stay at a healthy weight or lose weight if you are overweight. If you want to lose weight, talk with your doctor. It is important that you do not lose weight too fast.  Start or continue an exercise plan as told by your doctor.  Drink enough fluids to keep your pee clear or pale yellow.  Take over-the-counter and prescription medicines only as told by your doctor.  Keep all  follow-up visits as told by your doctor. This is important. Contact a doctor if:  You have another gout attack.  You still have symptoms of a gout attack after10 days of treatment.  You have problems (side effects) because of your medicines.  You have chills or a fever.  You have burning pain when you pee (urinate).  You have pain in your lower back or belly. Get help right away if:  You have very bad pain.  Your pain cannot be controlled.  You cannot pee. This information is not intended to replace advice given to you by your health care provider. Make sure you discuss any questions you have with your health care provider. Document Released: 08/17/2008 Document Revised: 04/15/2016 Document Reviewed: 08/21/2015  2017 Elsevier

## 2017-03-03 DIAGNOSIS — M722 Plantar fascial fibromatosis: Secondary | ICD-10-CM | POA: Insufficient documentation

## 2017-03-03 DIAGNOSIS — M79671 Pain in right foot: Secondary | ICD-10-CM | POA: Insufficient documentation

## 2017-03-03 DIAGNOSIS — M79672 Pain in left foot: Secondary | ICD-10-CM

## 2017-03-03 NOTE — Progress Notes (Signed)
Office Visit Note  Patient: Steve Bernard             Date of Birth: 21-Jan-1966           MRN: [id number]             PCP: Glenda Chroman, MD Referring: Glenda Chroman, MD Visit Date: 03/09/2017 Occupation: @GUAROCC @    Subjective:  Follow-up   History of Present Illness: Steve Bernard is a 52 y.o. male  Last seen 11/05/2016 Patient states that he did really well with his heels after giving the injection at the last visit in December. Unfortunately, pain returned a few weeks ago. He is not interested in getting another shot of long-term relief of his heel pain from plantar fasciitis.  His gout is doing really well. He is observing proper diet, fluids,    Activities of Daily Living:  Patient reports morning stiffness for 15 minutes.   Patient Reports nocturnal pain.  Difficulty dressing/grooming: Reports Difficulty climbing stairs: Reports Difficulty getting out of chair: Reports Difficulty using hands for taps, buttons, cutlery, and/or writing: Reports   Review of Systems  Constitutional: Negative for fatigue.  HENT: Negative for mouth sores and mouth dryness.   Eyes: Negative for dryness.  Respiratory: Negative for shortness of breath.   Gastrointestinal: Negative for constipation and diarrhea.  Musculoskeletal: Negative for myalgias and myalgias.  Skin: Negative for sensitivity to sunlight.  Neurological: Negative for memory loss.  Psychiatric/Behavioral: Negative for sleep disturbance.    PMFS History:  Patient Active Problem List   Diagnosis Date Noted  . Heel pain, bilateral 03/03/2017  . Plantar fasciitis, bilateral 03/03/2017  . Idiopathic chronic gout, unspecified site, without tophus (tophi) 11/03/2016  . Primary osteoarthritis of both hands 11/03/2016  . History of Meniere's disease 11/03/2016    Past Medical History:  Diagnosis Date  . GERD (gastroesophageal reflux disease)   . Gout   . Hypertension   . Meniere disease 2017  . Vertigo 2017      Family History  Problem Relation Age of Onset  . Arthritis/Rheumatoid Mother   . Diabetes Mother   . Heart disease Father   . Arthritis/Rheumatoid Maternal Aunt    Past Surgical History:  Procedure Laterality Date  . ANTERIOR FUSION CERVICAL SPINE N/A 03/2015  . CHOLECYSTECTOMY    . COLONOSCOPY N/A 03/21/2013   Procedure: COLONOSCOPY;  Surgeon: Rogene Houston, MD;  Location: [facility];  Service: Endoscopy;  Laterality: N/A;  955-rescheduled to 7:30am Ann notified pt  . TONSILLECTOMY     Social History   Social History Narrative  . No narrative on file     Objective: Vital Signs: BP 128/78   Pulse 78   Resp 16   Ht 5\' 11"  (1.803 m)   Wt 296 lb (134.3 kg)   BMI 41.28 kg/m    Physical Exam  Constitutional: He is oriented to person, place, and time. He appears well-developed and well-nourished.  HENT:  Head: Normocephalic and atraumatic.  Eyes: Conjunctivae and EOM are normal. Pupils are equal, round, and reactive to light.  Neck: Normal range of motion. Neck supple.  Cardiovascular: Normal rate, regular rhythm and normal heart sounds.  Exam reveals no gallop and no friction rub.   No murmur heard. Pulmonary/Chest: Effort normal and breath sounds normal. No respiratory distress. He has no wheezes. He has no rales. He exhibits no tenderness.  Abdominal: Soft. He exhibits no distension and no mass. There is no tenderness. There  is no guarding.  Musculoskeletal: Normal range of motion.  Lymphadenopathy:    He has no cervical adenopathy.  Neurological: He is alert and oriented to person, place, and time. He exhibits normal muscle tone. Coordination normal.  Skin: Skin is warm and dry. Capillary refill takes less than 2 seconds. No rash noted.  Psychiatric: He has a normal mood and affect. His behavior is normal. Judgment and thought content normal.  Vitals reviewed.    Musculoskeletal Exam:  Full range of motion of all joints Grip strength is equal and strong  bilaterally Fiber myalgia tender points are all absent  CDAI Exam: CDAI Homunculus Exam:   Joint Counts:  CDAI Tender Joint count: 0 CDAI Swollen Joint count: 0  Global Assessments:  Patient Global Assessment: 8 Provider Global Assessment: 8  CDAI Calculated Score: 16    Investigation: Findings:  Patient had autoimmune workup back in 06/21/2016. This was reviewed with the patient  06/21/2016 ANA with titer, sed rate, rheumatoid factor, CCP and P-ANCA CBC with diff normal CMP normal except for elevated non fasting glucose 160  No visits with results within 6 Month(s) from this visit.  Latest known visit with results is:  Hospital Outpatient Visit on 04/05/2016  Component Date Value Ref Range Status  . Creatinine, Ser 04/05/2016 0.90  0.61 - 1.24 mg/dL Final      Imaging: No results found.  Speciality Comments: No specialty comments available.    Procedures:  No procedures performed Allergies: Patient has no known allergies.   Assessment / Plan:     Visit Diagnoses: Primary osteoarthritis of both hands  Idiopathic chronic gout of multiple sites without tophus - Plan: CBC with Differential/Platelet, COMPLETE METABOLIC PANEL WITH GFR, Uric acid  History of Meniere's disease  Heel pain, bilateral  Plantar fasciitis, bilateral   Plan: #1: Gout. Doing well. No flare. Taking medications as prescribed, observing proper diet for gout Not using alcohol Taking medications properly  #2: High-risk prescription Allopurinol 300 mg every day Has not had to use colchicine.  #3: Plantar fasciitis bilaterally with right worse than left. Did well with cortisone injection at the last visit. Started having pain again a few weeks ago. Rates his pain as 10 on a scale of 0-10. Pain went all the way down to 0 after the injection.  #4: Osteoarthritis of bilateral feet with pain. Advised patient to get inserts from [facility] on [address].  #5: Refill Voltaren  gel #6: Refill allopurinol 90 day supply with a refill Take 1 every day #7: I wrote a prescription for culture seen (patient states that his last prescription expired and they threw away the medication. I've asked him not to fill this medication until he has a flare. The medicine this to be used only during flare. 1 by mouth daily until flare resolves Dispense :30 pills with 2 refills  #7: Return to clinic in 5 months  Orders: Orders Placed This Encounter  Procedures  . CBC with Differential/Platelet  . COMPLETE METABOLIC PANEL WITH GFR  . Uric acid   Meds ordered this encounter  Medications  . colchicine 0.6 MG tablet    Sig: Take 1 tablet (0.6 mg total) by mouth daily.    Dispense:  30 tablet    Refill:  2    Order Specific Question:   Supervising Provider    Answer:   Bo Merino [[id number]]  . allopurinol (ZYLOPRIM) 300 MG tablet    Sig: Take 1 tablet (300 mg total) by mouth  daily.    Dispense:  90 tablet    Refill:  1    Order Specific Question:   Supervising Provider    Answer:   Bo Merino [[id number]]  . diclofenac sodium (VOLTAREN) 1 % GEL    Sig: Apply 4 g topically 4 (four) times daily. Voltaren Gel 3 grams to 3 large joints upto TID 3 TUBES with 3 refills    Dispense:  3 Tube    Refill:  3    Voltaren Gel 3 grams to 3 large joints upto TID 3 TUBES with 3 refills    Order Specific Question:   Supervising Provider    Answer:   Bo Merino [phone]    Face-to-face time spent with patient was 40 minutes. 50% of time was spent in counseling and coordination of care.  Follow-Up Instructions: Return in about 5 months (around 08/09/2017) for gout, plantar fasciitis, heel pain  r>l, meniere's dz,.   Eliezer Lofts, PA-C  Note - This record has been created using [employer].  Chart creation errors have been sought, but may not always  have been located. Such creation errors do not reflect on  the standard of medical care.

## 2017-03-09 ENCOUNTER — Ambulatory Visit (INDEPENDENT_AMBULATORY_CARE_PROVIDER_SITE_OTHER): Payer: BLUE CROSS/BLUE SHIELD | Admitting: Rheumatology

## 2017-03-09 ENCOUNTER — Encounter: Payer: Self-pay | Admitting: Rheumatology

## 2017-03-09 VITALS — BP 128/78 | HR 78 | Resp 16 | Ht 71.0 in | Wt 296.0 lb

## 2017-03-09 DIAGNOSIS — M19041 Primary osteoarthritis, right hand: Secondary | ICD-10-CM

## 2017-03-09 DIAGNOSIS — M1A09X0 Idiopathic chronic gout, multiple sites, without tophus (tophi): Secondary | ICD-10-CM

## 2017-03-09 DIAGNOSIS — Z8669 Personal history of other diseases of the nervous system and sense organs: Secondary | ICD-10-CM | POA: Diagnosis not present

## 2017-03-09 DIAGNOSIS — M79671 Pain in right foot: Secondary | ICD-10-CM

## 2017-03-09 DIAGNOSIS — M722 Plantar fascial fibromatosis: Secondary | ICD-10-CM

## 2017-03-09 DIAGNOSIS — M79672 Pain in left foot: Secondary | ICD-10-CM | POA: Diagnosis not present

## 2017-03-09 DIAGNOSIS — M19042 Primary osteoarthritis, left hand: Secondary | ICD-10-CM | POA: Diagnosis not present

## 2017-03-09 LAB — CBC WITH DIFFERENTIAL/PLATELET
BASOS ABS: 0 {cells}/uL (ref 0–200)
Basophils Relative: 0 %
EOS ABS: 158 {cells}/uL (ref 15–500)
Eosinophils Relative: 2 %
HEMATOCRIT: 44.8 % (ref 38.5–50.0)
HEMOGLOBIN: 15.1 g/dL (ref 13.2–17.1)
Lymphocytes Relative: 36 %
Lymphs Abs: 2844 cells/uL (ref 850–3900)
MCH: 30.9 pg (ref 27.0–33.0)
MCHC: 33.7 g/dL (ref 32.0–36.0)
MCV: 91.6 fL (ref 80.0–100.0)
MONO ABS: 632 {cells}/uL (ref 200–950)
MONOS PCT: 8 %
MPV: 9.5 fL (ref 7.5–12.5)
NEUTROS ABS: 4266 {cells}/uL (ref 1500–7800)
Neutrophils Relative %: 54 %
PLATELETS: 282 10*3/uL (ref 140–400)
RBC: 4.89 MIL/uL (ref 4.20–5.80)
RDW: 14.7 % (ref 11.0–15.0)
WBC: 7.9 10*3/uL (ref 3.8–10.8)

## 2017-03-09 LAB — COMPLETE METABOLIC PANEL WITH GFR
ALBUMIN: 4.5 g/dL (ref 3.6–5.1)
ALK PHOS: 78 U/L (ref 40–115)
ALT: 25 U/L (ref 9–46)
AST: 28 U/L (ref 10–35)
BILIRUBIN TOTAL: 1 mg/dL (ref 0.2–1.2)
BUN: 17 mg/dL (ref 7–25)
CALCIUM: 10 mg/dL (ref 8.6–10.3)
CO2: 27 mmol/L (ref 20–31)
CREATININE: 1.09 mg/dL (ref 0.70–1.33)
Chloride: 99 mmol/L (ref 98–110)
GFR, Est Non African American: 78 mL/min (ref >=60)
Glucose, Bld: 141 mg/dL — ABNORMAL HIGH (ref 65–99)
Potassium: 4.4 mmol/L (ref 3.5–5.3)
Sodium: 140 mmol/L (ref 135–146)
TOTAL PROTEIN: 7.3 g/dL (ref 6.1–8.1)

## 2017-03-09 MED ORDER — COLCHICINE 0.6 MG PO TABS: 0.6000 mg | ORAL_TABLET | Freq: Every day | ORAL | 2 refills | Status: DC

## 2017-03-09 MED ORDER — DICLOFENAC SODIUM 1 % TD GEL: 4.0000 g | Freq: Four times a day (QID) | TRANSDERMAL | 3 refills | Status: AC

## 2017-03-09 MED ORDER — ALLOPURINOL 300 MG PO TABS: 300.0000 mg | ORAL_TABLET | Freq: Every day | ORAL | 1 refills | Status: AC

## 2017-03-09 NOTE — Patient Instructions (Signed)
Gout Gout is painful swelling that can occur in some of your joints. Gout is a type of arthritis. This condition is caused by having too much uric acid in your body. Uric acid is a chemical that forms when your body breaks down substances called purines. Purines are important for building body proteins. When your body has too much uric acid, sharp crystals can form and build up inside your joints. This causes pain and swelling. Gout attacks can happen quickly and be very painful (acute gout). Over time, the attacks can affect more joints and become more frequent (chronic gout). Gout can also cause uric acid to build up under your skin and inside your kidneys. What are the causes? This condition is caused by too much uric acid in your blood. This can occur because:  Your kidneys do not remove enough uric acid from your blood. This is the most common cause.  Your body makes too much uric acid. This can occur with some cancers and cancer treatments. It can also occur if your body is breaking down too many red blood cells (hemolytic anemia).  You eat too many foods that are high in purines. These foods include organ meats and some seafood. Alcohol, especially beer, is also high in purines. A gout attack may be triggered by trauma or stress. What increases the risk? This condition is more likely to develop in people who:  Have a family history of gout.  Are male and middle-aged.  Are male and have gone through menopause.  Are obese.  Frequently drink alcohol, especially beer.  Are dehydrated.  Lose weight too quickly.  Have an organ transplant.  Have lead poisoning.  Take certain medicines, including aspirin, cyclosporine, diuretics, levodopa, and niacin.  Have kidney disease or psoriasis. What are the signs or symptoms? An attack of acute gout happens quickly. It usually occurs in just one joint. The most common place is the big toe. Attacks often start at night. Other joints that  may be affected include joints of the feet, ankle, knee, fingers, wrist, or elbow. Symptoms may include:  Severe pain.  Warmth.  Swelling.  Stiffness.  Tenderness. The affected joint may be very painful to touch.  Shiny, red, or purple skin.  Chills and fever. Chronic gout may cause symptoms more frequently. More joints may be involved. You may also have white or yellow lumps (tophi) on your hands or feet or in other areas near your joints. How is this diagnosed? This condition is diagnosed based on your symptoms, medical history, and physical exam. You may have tests, such as:  Blood tests to measure uric acid levels.  Removal of joint fluid with a needle (aspiration) to look for uric acid crystals.  X-rays to look for joint damage. How is this treated? Treatment for this condition has two phases: treating an acute attack and preventing future attacks. Acute gout treatment may include medicines to reduce pain and swelling, including:  NSAIDs.  Steroids. These are strong anti-inflammatory medicines that can be taken by mouth (orally) or injected into a joint.  Colchicine. This medicine relieves pain and swelling when it is taken soon after an attack. It can be given orally or through an IV tube. Preventive treatment may include:  Daily use of smaller doses of NSAIDs or colchicine.  Use of a medicine that reduces uric acid levels in your blood.  Changes to your diet. You may need to see a specialist about healthy eating (dietitian). Follow these instructions at home:  During a Gout Attack   If directed, apply ice to the affected area:  Put ice in a plastic bag.  Place a towel between your skin and the bag.  Leave the ice on for 20 minutes, 2-3 times a day.  Rest the joint as much as possible. If the affected joint is in your leg, you may be given crutches to use.  Raise (elevate) the affected joint above the level of your heart as often as possible.  Drink enough  fluids to keep your urine clear or pale yellow.  Take over-the-counter and prescription medicines only as told by your health care provider.  Do not drive or operate heavy machinery while taking prescription pain medicine.  Follow instructions from your health care provider about eating or drinking restrictions.  Return to your normal activities as told by your health care provider. Ask your health care provider what activities are safe for you. Avoiding Future Gout Attacks   Follow a low-purine diet as told by your dietitian or health care provider. Avoid foods and drinks that are high in purines, including liver, kidney, anchovies, asparagus, herring, mushrooms, mussels, and beer.  Limit alcohol intake to no more than 1 drink a day for nonpregnant women and 2 drinks a day for men. One drink equals 12 oz of beer, 5 oz of wine, or 1 oz of hard liquor.  Maintain a healthy weight or lose weight if you are overweight. If you want to lose weight, talk with your health care provider. It is important that you do not lose weight too quickly.  Start or maintain an exercise program as told by your health care provider.  Drink enough fluids to keep your urine clear or pale yellow.  Take over-the-counter and prescription medicines only as told by your health care provider.  Keep all follow-up visits as told by your health care provider. This is important. Contact a health care provider if:  You have another gout attack.  You continue to have symptoms of a gout attack after10 days of treatment.  You have side effects from your medicines.  You have chills or a fever.  You have burning pain when you urinate.  You have pain in your lower back or belly. Get help right away if:  You have severe or uncontrolled pain.  You cannot urinate. This information is not intended to replace advice given to you by your health care provider. Make sure you discuss any questions you have with your health  care provider. Document Released: 11/05/2000 Document Revised: 04/15/2016 Document Reviewed: 08/21/2015 Elsevier Interactive Patient Education  2017 [place] A heel spur is a bony growth that forms on the bottom of your heel bone (calcaneus). Heel spurs are common and do not always cause pain. However, heel spurs often cause inflammation in the strong band of tissue that runs underneath the bone of your foot (plantar fascia). When this happens, you may feel pain on the bottom of your foot, near your heel. What are the causes? The cause of heel spurs is not completely understood. They may be caused by pressure on the heel. Or, they may stem from the muscle attachments (tendons) near the spur pulling on the heel. What increases the risk? You may be at risk for a heel spur if you:  Are older than 40.  Are overweight.  Have wear and tear arthritis (osteoarthritis).  Have plantar fascia inflammation. What are the signs or symptoms? Some people have heel spurs but  no symptoms. If you do have symptoms, they may include:  Pain in the bottom of your heel.  Pain that is worse when you first get out of bed.  Pain that gets worse after walking or standing. How is this diagnosed? Your health care provider may diagnose a heel spur based on your symptoms and a physical exam. You may also have an X-ray of your foot to check for a bony growth coming from the calcaneus. How is this treated? Treatment aims to relieve the pain from the heel spur. This may include:  Stretching exercises.  Losing weight.  Wearing specific shoes, inserts, or orthotics for comfort and support.  Wearing splints at night to properly position your feet.  Taking over-the-counter medicine to relieve pain.  Being treated with high-intensity sound waves to break up the heel spur (extracorporeal shock wave therapy).  Getting steroid injections in your heel to reduce swelling and ease pain.  Having surgery  if your heel spur causes long-term (chronic) pain. Follow these instructions at home:  Take medicines only as directed by your health care provider.  Ask your health care provider if you should use ice or cold packs on the painful areas of your heel or foot.  Avoid activities that cause you pain until you recover or as directed by your health care provider.  Stretch before exercising or being physically active.  Wear supportive shoes that fit well as directed by your health care provider. You might need to buy new shoes. Wearing old shoes or shoes that do not fit correctly may not provide the support that you need.  Lose weight if your health care provider thinks you should. This can relieve pressure on your foot that may be causing pain and discomfort. Contact a health care provider if:  Your pain continues or gets worse. This information is not intended to replace advice given to you by your health care provider. Make sure you discuss any questions you have with your health care provider. Document Released: 12/15/2005 Document Revised: 04/15/2016 Document Reviewed: 01/09/2014 Elsevier Interactive Patient Education  2017 [employer].

## 2017-03-10 LAB — URIC ACID: URIC ACID, SERUM: 6.4 mg/dL (ref 4.0–8.0)

## 2017-03-11 ENCOUNTER — Telehealth: Payer: Self-pay | Admitting: Radiology

## 2017-03-11 NOTE — Telephone Encounter (Signed)
-----   Message from Eliezer Lofts, [place] sent at 03/10/2017  5:06 PM EDT ----- Tell patient And send copy of labs to PCP #1: Uric acid is within normal range. (We would like to be less than 5 in the future) #2: CMP with GFR is within normal limits except for nonfasting so  #3: CBC with differential is normal

## 2017-03-11 NOTE — Telephone Encounter (Signed)
Pa request received via fax for V[employer] Cover my meds [id number]  April 27, 1966

## 2017-03-11 NOTE — Telephone Encounter (Signed)
I have called patient to advise labs are normal  

## 2017-03-15 NOTE — Telephone Encounter (Signed)
A prior authorization has been submitted for Voltaren Gel through cover my meds. Will update the patient when we get a response.   Anett Ranker, [place], CPhT

## 2017-03-16 NOTE — Telephone Encounter (Signed)
Received a fax from [place] regarding a prior authorization [place] for Voltaren Gel.   Reference number:[id number] Phone number: [phone]  Will send documents to scan center.  Left message for patient to call back.   Marquies Wanat, [place], CPhT  3:43 PM

## 2017-08-01 NOTE — Progress Notes (Signed)
Office Visit Note  Patient: Steve Bernard             Date of Birth: 07/13/1966           MRN: [id number]             PCP: Glenda Chroman, MD Referring: Glenda Chroman, MD Visit Date: 08/10/2017 Occupation: @GUAROCC @    Subjective:  Pain in heels.   History of Present Illness: Steve Bernard is a 51 y.o. male with history of gouty arthropathy. He states he has not had any gout flares since the last visit. He does not recall his last gout flare. He's been taking allopurinol 300 mg a day which helps his symptoms. He continues to have discomfort in his bilateral heels. He has orthotics which she uses on regular basis. He had cortisone injection to his left heel which helped temporarily. He states his right heel has been quite painful. His neck bothers him off and on.  Activities of Daily Living:  Patient reports morning stiffness for 10 minutes.   Patient Denies nocturnal pain.  Difficulty dressing/grooming: Denies Difficulty climbing stairs: Denies Difficulty getting out of chair: Reports Difficulty using hands for taps, buttons, cutlery, and/or writing: Denies   Review of Systems  Constitutional: Negative for fatigue, night sweats and weakness ( ).  HENT: Negative for mouth sores, mouth dryness and nose dryness.   Eyes: Negative for redness and dryness.  Respiratory: Negative for shortness of breath and difficulty breathing.   Cardiovascular: Negative for chest pain, palpitations, hypertension, irregular heartbeat and swelling in legs/feet.  Gastrointestinal: Negative for constipation and diarrhea.  Endocrine: Negative for increased urination.  Musculoskeletal: Positive for arthralgias, joint pain and morning stiffness. Negative for joint swelling, myalgias, muscle weakness, muscle tenderness and myalgias.  Skin: Negative for color change, rash, hair loss, nodules/bumps, skin tightness, ulcers and sensitivity to sunlight.  Allergic/Immunologic: Negative for susceptible to infections.    Neurological: Negative for dizziness, fainting, memory loss and night sweats.  Hematological: Negative for swollen glands.  Psychiatric/Behavioral: Negative for depressed mood and sleep disturbance. The patient is not nervous/anxious.     PMFS History:  Patient Active Problem List   Diagnosis Date Noted  . DDD (degenerative disc disease), cervical 08/10/2017  . Heel pain, bilateral 03/03/2017  . Plantar fasciitis, bilateral 03/03/2017  . Idiopathic chronic gout, unspecified site, without tophus (tophi) 11/03/2016  . Primary osteoarthritis of both hands 11/03/2016  . History of Meniere's disease 11/03/2016    Past Medical History:  Diagnosis Date  . GERD (gastroesophageal reflux disease)   . Gout   . Hypertension   . Meniere disease 2017  . Vertigo 2017    Family History  Problem Relation Age of Onset  . Arthritis/Rheumatoid Mother   . Diabetes Mother   . Heart disease Father   . Arthritis/Rheumatoid Maternal Aunt    Past Surgical History:  Procedure Laterality Date  . ANTERIOR FUSION CERVICAL SPINE N/A 03/2015  . CHOLECYSTECTOMY    . COLONOSCOPY N/A 03/21/2013   Procedure: COLONOSCOPY;  Surgeon: Rogene Houston, MD;  [facility];  Service: Endoscopy;  Laterality: N/A;  955-rescheduled to 7:30am Ann notified pt  . TONSILLECTOMY     Social History   Social History Narrative  . No narrative on file     Objective: Vital Signs: BP 125/79 (BP Location: Left Arm, Patient Position: Sitting, Cuff Size: Normal)   Pulse 69   Ht 5\' 11"  (1.803 m)   Wt 290  lb (131.5 kg)   BMI 40.45 kg/m    Physical Exam  Constitutional: He is oriented to person, place, and time. He appears well-developed and well-nourished.  HENT:  Head: Normocephalic and atraumatic.  Eyes: Pupils are equal, round, and reactive to light. Conjunctivae and EOM are normal.  Neck: Normal range of motion. Neck supple.  Cardiovascular: Normal rate, regular rhythm and normal heart sounds.    Pulmonary/Chest: Effort normal and breath sounds normal.  Abdominal: Soft. Bowel sounds are normal.  Neurological: He is alert and oriented to person, place, and time.  Skin: Skin is warm and dry. Capillary refill takes less than 2 seconds.  Psychiatric: He has a normal mood and affect. His behavior is normal.  Nursing note and vitals reviewed.    Musculoskeletal Exam: C-spine and thoracic lumbar spine good range of motion. Shoulder joints of the joints wrist joint MCPs PIPs DIPs with good range of motion. Hip joints knee joints ankles MTPs PIPs with good range of motion. He has tenderness on palpation of bilateral plantar fascia Morse on the right side.  CDAI Exam: No CDAI exam completed.    Investigation: No additional findings.Uric Acid: 6.4 in 02/2017 CBC Latest Ref Rng & Units 03/09/2017 01/31/2011  WBC 3.8 - 10.8 K/uL 7.9 7.3  Hemoglobin 13.2 - 17.1 g/dL 15.1 16.0  Hematocrit 38.5 - 50.0 % 44.8 45.2  Platelets 140 - 400 K/uL 282 228   CMP Latest Ref Rng & Units 03/09/2017 04/05/2016 01/31/2011  Glucose 65 - 99 mg/dL 141(H) - 112(H)  BUN 7 - 25 mg/dL 17 - 15  Creatinine 0.70 - 1.33 mg/dL 1.09 0.90 1.15  Sodium 135 - 146 mmol/L 140 - 140  Potassium 3.5 - 5.3 mmol/L 4.4 - 4.5  Chloride 98 - 110 mmol/L 99 - 103  CO2 20 - 31 mmol/L 27 - 30  Calcium 8.6 - 10.3 mg/dL 10.0 - 9.8  Total Protein 6.1 - 8.1 g/dL 7.3 - 7.1  Total Bilirubin 0.2 - 1.2 mg/dL 1.0 - 0.9  Alkaline Phos 40 - 115 U/L 78 - 52  AST 10 - 35 U/L 28 - 24  ALT 9 - 46 U/L 25 - 20    Imaging: No results found.  Speciality Comments: No specialty comments available.    Procedures:  Foot Inj Date/Time: 08/10/2017 9:39 AM Performed by: Bo Merino Authorized by: Bo Merino   Consent Given by:  Patient Site marked: the procedure site was marked   Timeout: prior to procedure the correct patient, procedure, and site was verified   Indications:  Fasciitis Condition: Plantar Fasciitis   Location:  right plantar fascia muscle   Prep: patient was prepped and draped in usual sterile fashion   Needle Size:  27 G Medications:  0.5 mL lidocaine 1 %; 20 mg triamcinolone acetonide 40 MG/ML Patient Tolerance:  Patient tolerated the procedure well with no immediate complications    Allergies: Patient has no known allergies.   Assessment / Plan:     Visit Diagnoses: Idiopathic chronic gout without tophus, unspecified site -history well on allopurinol he has not had a flare in a long time. +CCP, -RF, allopurinol,colchicineUric Acid: 6.4 in 02/2017 - Plan: Uric acid  Plantar fasciitis, bilateral: He is been having increased pain and discomfort in his bilateral feet over plantar fascia. Right heel was extremely painful. There is request after informed consent was obtained right heel was injected with cortisone as described above. I will refer him to physical therapy.  DDD (degenerative disc disease),  cervical - S/P fusion of C4, C5 and C6. 2016. Chronic pain  History of hypertension: His blood pressure is elevated at advised to monitor.  History of gastroesophageal reflux (GERD)  Medication monitoring encounter - Plan: CBC with Differential/Platelet, COMPLETE METABOLIC PANEL WITH GFR   Obesity: Weight loss diet and exercise was discussed at length. Association of gout with heart disease was also discussed.   Orders: Orders Placed This Encounter  Procedures  . Foot Injection  . CBC with Differential/Platelet  . COMPLETE METABOLIC PANEL WITH GFR  . Uric acid  . Ambulatory referral to Physical Therapy   No orders of the defined types were placed in this encounter.   Face-to-face time spent with patient was 30 minutes. Greater than 50% of time was spent in counseling and coordination of care.  Follow-Up Instructions: Return in about 6 months (around 02/07/2018) for Gout.   Bo Merino, MD  Note - This record has been created using Editor, commissioning.  Chart creation errors have  been sought, but may not always  have been located. Such creation errors do not reflect on  the standard of medical care.

## 2017-08-10 ENCOUNTER — Encounter: Payer: Self-pay | Admitting: Rheumatology

## 2017-08-10 ENCOUNTER — Ambulatory Visit (INDEPENDENT_AMBULATORY_CARE_PROVIDER_SITE_OTHER): Payer: Commercial Managed Care - PPO | Admitting: Rheumatology

## 2017-08-10 VITALS — BP 125/79 | HR 69 | Ht 71.0 in | Wt 290.0 lb

## 2017-08-10 DIAGNOSIS — M1A00X0 Idiopathic chronic gout, unspecified site, without tophus (tophi): Secondary | ICD-10-CM | POA: Diagnosis not present

## 2017-08-10 DIAGNOSIS — M722 Plantar fascial fibromatosis: Secondary | ICD-10-CM | POA: Diagnosis not present

## 2017-08-10 DIAGNOSIS — Z8719 Personal history of other diseases of the digestive system: Secondary | ICD-10-CM | POA: Diagnosis not present

## 2017-08-10 DIAGNOSIS — Z8679 Personal history of other diseases of the circulatory system: Secondary | ICD-10-CM

## 2017-08-10 DIAGNOSIS — M5030 Other cervical disc degeneration, unspecified cervical region: Secondary | ICD-10-CM | POA: Insufficient documentation

## 2017-08-10 DIAGNOSIS — Z5181 Encounter for therapeutic drug level monitoring: Secondary | ICD-10-CM

## 2017-08-10 LAB — COMPLETE METABOLIC PANEL WITH GFR
AG Ratio: 1.8 (calc) (ref 1.0–2.5)
ALBUMIN MSPROF: 4.4 g/dL (ref 3.6–5.1)
ALKALINE PHOSPHATASE (APISO): 86 U/L (ref 40–115)
ALT: 22 U/L (ref 9–46)
AST: 26 U/L (ref 10–35)
BILIRUBIN TOTAL: 0.8 mg/dL (ref 0.2–1.2)
BUN: 17 mg/dL (ref 7–25)
CHLORIDE: 100 mmol/L (ref 98–110)
CO2: 28 mmol/L (ref 20–32)
CREATININE: 1.1 mg/dL (ref 0.70–1.33)
Calcium: 9.3 mg/dL (ref 8.6–10.3)
GFR, EST AFRICAN AMERICAN: 89 mL/min/{1.73_m2} (ref >=60)
GFR, Est Non African American: 77 mL/min/{1.73_m2} (ref >=60)
Globulin: 2.5 g/dL (calc) (ref 1.9–3.7)
Glucose, Bld: 212 mg/dL — ABNORMAL HIGH (ref 65–99)
Potassium: 4.3 mmol/L (ref 3.5–5.3)
SODIUM: 138 mmol/L (ref 135–146)
Total Protein: 6.9 g/dL (ref 6.1–8.1)

## 2017-08-10 LAB — URIC ACID: URIC ACID, SERUM: 7.5 mg/dL (ref 4.0–8.0)

## 2017-08-10 LAB — CBC WITH DIFFERENTIAL/PLATELET
BASOS ABS: 37 {cells}/uL (ref 0–200)
Basophils Relative: 0.5 %
EOS ABS: 197 {cells}/uL (ref 15–500)
Eosinophils Relative: 2.7 %
HEMATOCRIT: 42.2 % (ref 38.5–50.0)
HEMOGLOBIN: 14.7 g/dL (ref 13.2–17.1)
LYMPHS ABS: 2679 {cells}/uL (ref 850–3900)
MCH: 31 pg (ref 27.0–33.0)
MCHC: 34.8 g/dL (ref 32.0–36.0)
MCV: 89 fL (ref 80.0–100.0)
MPV: 9.6 fL (ref 7.5–12.5)
Monocytes Relative: 6.3 %
NEUTROS ABS: 3927 {cells}/uL (ref 1500–7800)
Neutrophils Relative %: 53.8 %
Platelets: 272 10*3/uL (ref 140–400)
RBC: 4.74 10*6/uL (ref 4.20–5.80)
RDW: 13.2 % (ref 11.0–15.0)
Total Lymphocyte: 36.7 %
WBC mixed population: 460 cells/uL (ref 200–950)
WBC: 7.3 10*3/uL (ref 3.8–10.8)

## 2017-08-10 MED ORDER — LIDOCAINE HCL 1 % IJ SOLN
0.5000 mL | INTRAMUSCULAR | Status: AC | PRN
  Administered 2017-08-10: .5 mL

## 2017-08-10 MED ORDER — TRIAMCINOLONE ACETONIDE 40 MG/ML IJ SUSP
20.0000 mg | INTRAMUSCULAR | Status: AC | PRN
  Administered 2017-08-10: 20 mg

## 2017-08-10 NOTE — Patient Instructions (Signed)
Plantar Fasciitis Rehab  Ask your health care provider which exercises are safe for you. Do exercises exactly as told by your health care provider and adjust them as directed. It is normal to feel mild stretching, pulling, tightness, or discomfort as you do these exercises, but you should stop right away if you feel sudden pain or your pain gets worse. Do not begin these exercises until told by your health care provider.  Stretching and range of motion exercises  These exercises warm up your muscles and joints and improve the movement and flexibility of your foot. These exercises also help to relieve pain.  Exercise A: Plantar fascia stretch    1. Sit with your left / right leg crossed over your opposite knee.  2. Hold your heel with one hand with that thumb near your arch. With your other hand, hold your toes and gently pull them back toward the top of your foot. You should feel a stretch on the bottom of your toes or your foot or both.  3. Hold this stretch for__________ seconds.  4. Slowly release your toes and return to the starting position.  Repeat __________ times. Complete this exercise __________ times a day.  Exercise B: Gastroc, standing    1. Stand with your hands against a [place].  2. Extend your left / right leg behind you, and bend your front knee slightly.  3. Keeping your heels on the floor and keeping your back knee straight, shift your weight toward the [place] without arching your back. You should feel a gentle stretch in your left / right calf.  4. Hold this position for __________ seconds.  Repeat __________ times. Complete this exercise __________ times a day.  Exercise C: Soleus, standing  1. Stand with your hands against a [place].  2. Extend your left / right leg behind you, and bend your front knee slightly.  3. Keeping your heels on the floor, bend your back knee and slightly shift your weight over the back leg. You should feel a gentle stretch deep in your calf.   4. Hold this position for __________ seconds.  Repeat __________ times. Complete this exercise __________ times a day.  Exercise D: Gastrocsoleus, standing  1. Stand with the ball of your left / right foot on a step. The ball of your foot is on the walking surface, right under your toes.  2. Keep your other foot firmly on the same step.  3. Hold onto the [place] or a railing for balance.  4. Slowly lift your other foot, allowing your body weight to press your heel down over the edge of the step. You should feel a stretch in your left / right calf.  5. Hold this position for __________ seconds.  6. Return both feet to the step.  7. Repeat this exercise with a slight bend in your left / right knee.  Repeat __________ times with your left / right knee straight and __________ times with your left / right knee bent. Complete this exercise __________ times a day.  Balance exercise  This exercise builds your balance and strength control of your arch to help take pressure off your plantar fascia.  Exercise E: Single leg stand  1. Without shoes, stand near a railing or in a doorway. You may hold onto the railing or door frame as needed.  2. Stand on your left / right foot. Keep your big toe down on the floor and try to keep your arch lifted. Do not   let your foot roll inward.  3. Hold this position for __________ seconds.  4. If this exercise is too easy, you can try it with your eyes closed or while standing on a pillow.  Repeat __________ times. Complete this exercise __________ times a day.  This information is not intended to replace advice given to you by your health care provider. Make sure you discuss any questions you have with your health care provider.  Document Released: 11/08/2005 Document Revised: 07/13/2016 Document Reviewed: 09/22/2015  Elsevier Interactive Patient Education © 2018 [employer]

## 2017-08-11 NOTE — Progress Notes (Signed)
Labs are stable.

## 2017-08-12 ENCOUNTER — Telehealth: Payer: Self-pay | Admitting: Radiology

## 2017-08-12 NOTE — Telephone Encounter (Signed)
I have called patient to advise labs are stable  

## 2017-08-12 NOTE — Telephone Encounter (Signed)
-----   Message from Bo Merino, MD sent at 08/11/2017  1:41 PM EDT -----  Labs are stable

## 2017-08-15 ENCOUNTER — Ambulatory Visit: Payer: Commercial Managed Care - PPO | Attending: Rheumatology | Admitting: Physical Therapy

## 2017-08-15 DIAGNOSIS — M79672 Pain in left foot: Secondary | ICD-10-CM | POA: Diagnosis present

## 2017-08-15 DIAGNOSIS — M79671 Pain in right foot: Secondary | ICD-10-CM | POA: Diagnosis present

## 2017-08-15 DIAGNOSIS — M25671 Stiffness of right ankle, not elsewhere classified: Secondary | ICD-10-CM | POA: Diagnosis present

## 2017-08-15 DIAGNOSIS — M25672 Stiffness of left ankle, not elsewhere classified: Secondary | ICD-10-CM | POA: Diagnosis present

## 2017-08-15 NOTE — Therapy (Deleted)
[facility] [place], [place], [address] Phone: [phone]   Fax:  [phone]  Physical Therapy Treatment  Patient Details  Name: Steve Bernard MRN: [id number] Date of Birth: [birth year] Referring Provider: Cy Blamer MD.  Encounter Date: 08/15/2017      PT End of Session - 08/15/17 0941    Visit Number 1   Number of Visits 12   Date for PT Re-Evaluation 09/26/17   PT Start Time 0900   PT Stop Time 0948   PT Time Calculation (min) 48 min   Activity Tolerance Patient tolerated treatment well   Behavior During Therapy [facility] for tasks assessed/performed      Past Medical History:  Diagnosis Date  . GERD (gastroesophageal reflux disease)   . Gout   . Hypertension   . Meniere disease 2017  . Vertigo 2017    Past Surgical History:  Procedure Laterality Date  . ANTERIOR FUSION CERVICAL SPINE N/A 03/2015  . CHOLECYSTECTOMY    . COLONOSCOPY N/A 03/21/2013   Procedure: COLONOSCOPY;  Surgeon: Rogene Houston, MD;  Location[facility];  Service: Endoscopy;  Laterality: N/A;  955-rescheduled to 7:30am Ann notified pt  . TONSILLECTOMY      There were no vitals filed for this visit.      Subjective Assessment - 08/15/17 0946    Subjective The patient has had ongoing and worsening heel pain which started on the left approximately 6 months ago.  His right foot now hurts.  He has inserts that are helping.  His pain at rest is a 2/10.  His pain can rise to much higher levels though when on hois feet for long periods of time and when wearing his hunting boots.   Pertinent History DM.   How long can you walk comfortably? Short community distances.   Patient Stated Goals Get out of pain.   Currently in Pain? Yes   Pain Score 2    Pain Location Foot   Pain Orientation Right;Left   Pain Descriptors / Indicators Aching;Throbbing   Pain Type Chronic pain   Pain Onset More than a month ago   Pain Frequency Constant   Aggravating Factors  Standing.   Pain Relieving Factors Being off feet.            [facility] PT Assessment - 08/15/17 0001      Assessment   Medical Diagnosis Plantar fasciitis bilateral.   Referring Provider Cy Blamer MD.   Onset Date/Surgical Date --  6 months.     Precautions   Precautions None     Balance Screen   Has the patient fallen in the past 6 months No   Has the patient had a decrease in activity level because of a fear of falling?  Yes   Is the patient reluctant to leave their home because of a fear of falling?  No     Home [employer] residence     Prior Function   Level of Independence Independent     Cognition   Overall Cognitive Status Within Functional Limits for tasks assessed     Posture/Postural Control   Posture Comments Bilateral ankles with tendency for pronation left greater than right.  Left navicular bone more prominent ofn left.     ROM / Strength   AROM / PROM / Strength AROM;Strength     AROM   Overall AROM Comments left ankle dorsiflexion with knee extended to neutral and with  knee flexed= 5 degrees.  Right ankle dorsiflexion with knee extended= 10 degrees and with knee flexed= 15 degrees.     Strength   Overall Strength Comments Normal bilateral foot/ankle strength.     Palpation   Palpation comment Tender to palpation lateral aspect of  both heels.     Ambulation/Gait   Gait Comments Decreased stance time left greater than right due to pain.                     [facility] Adult PT Treatment/Exercise - 08/15/17 0001      Modalities   Modalities Electrical Stimulation     Electrical Stimulation   Electrical Stimulation Location Bilateral heels.   Electrical Stimulation Action Pre-mod.   Electrical Stimulation Parameters Constant at 80-150 Hz x 15 minutes.   Electrical Stimulation Goals Pain                     PT Long Term Goals - 08/15/17 1038      PT LONG TERM GOAL  #1   Title Independent with a HEP.   Time 6   Period Weeks   Status New     PT LONG TERM GOAL #2   Title Normal dorsiflexion bilaterall wiht knees extended and flexed.   Time 6   Period Weeks   Status New     PT LONG TERM GOAL #3   Title Walk a community distance with bilateral foot pain not > 2-3/10.   Time 6   Period Weeks   Status New     PT LONG TERM GOAL #4   Title Normal gait pattern.   Time 6   Period Weeks   Status New               Plan - 08/15/17 1028    Clinical Impression Statement The patient presents with bilateral foot pain left > right.  His left ankle into dorsiflexion is limited.  He has palpable pain over bilateral plantar fascia (lateral bands).  A left heel injection has been helpful as well has bilateral shoe inserts.  His gait is antalgic.  Patient will benefit from skilled physical therapy to address deficits and pain.   History and Personal Factors relevant to plan of care: DM.   Clinical Presentation Stable   Clinical Presentation due to: Injection in left heel helped.   Clinical Decision Making Low   Rehab Potential Good   PT Frequency 2x / week   PT Duration 6 weeks   PT Next Visit Plan U/S and IASTM; Rockerboard.  Gastroc-soleus stretch fotr home.  STW/M to bilateral calf musculature.   Consulted and Agree with Plan of Care Patient      Patient will benefit from skilled therapeutic intervention in order to improve the following deficits and impairments:  Abnormal gait, Pain, Decreased activity tolerance, Decreased range of motion  Visit Diagnosis: Pain in left foot - Plan: PT plan of care cert/re-cert  Pain in right foot - Plan: PT plan of care cert/re-cert  Stiffness of left ankle, not elsewhere classified - Plan: PT plan of care cert/re-cert  Stiffness of right ankle, not elsewhere classified - Plan: PT plan of care cert/re-cert     Problem List Patient Active Problem List   Diagnosis Date Noted  . DDD (degenerative disc  disease), cervical 08/10/2017  . Heel pain, bilateral 03/03/2017  . Plantar fasciitis, bilateral 03/03/2017  . Idiopathic chronic gout, unspecified site, without tophus (tophi) 11/03/2016  .  Primary osteoarthritis of both hands 11/03/2016  . History of Meniere's disease 11/03/2016    Lori-Ann Lindfors, [place] 08/15/2017, 11:09 AM  [facility] [address] [place], [place], [address] Phone: [phone]   Fax:  [phone]  Name: Steve Bernard MRN: [id number] Date of Birth: 13-Mar-1966

## 2017-08-15 NOTE — Therapy (Signed)
[facility]  [place], [place], [address] Phone: [phone]   Fax:  [phone]  Physical Therapy Evaluation  Patient Details  Name: Steve Bernard MRN: [id number] Date of Birth: [birth year] Referring Provider: Cy Blamer MD.  Encounter Date: 08/15/2017      PT End of Session - 08/15/17 0941    Visit Number 1   Number of Visits 12   Date for PT Re-Evaluation 09/26/17   PT Start Time 0900   PT Stop Time 0948   PT Time Calculation (min) 48 min   Activity Tolerance Patient tolerated treatment well   Behavior During Therapy [facility] for tasks assessed/performed      Past Medical History:  Diagnosis Date  . GERD (gastroesophageal reflux disease)   . Gout   . Hypertension   . Meniere disease 2017  . Vertigo 2017    Past Surgical History:  Procedure Laterality Date  . ANTERIOR FUSION CERVICAL SPINE N/A 03/2015  . CHOLECYSTECTOMY    . COLONOSCOPY N/A 03/21/2013   Procedure: COLONOSCOPY;  Surgeon: Rogene Houston, MD;  [facility];  Service: Endoscopy;  Laterality: N/A;  955-rescheduled to 7:30am Ann notified pt  . TONSILLECTOMY      There were no vitals filed for this visit.       Subjective Assessment - 08/15/17 0946    Subjective The patient has had ongoing and worsening heel pain which started on the left approximately 6 months ago.  His right foot now hurts.  He has inserts that are helping.  His pain at rest is a 2/10.  His pain can rise to much higher levels though when on hois feet for long periods of time and when wearing his hunting boots.   Pertinent History DM.   How long can you walk comfortably? Short community distances.   Patient Stated Goals Get out of pain.   Currently in Pain? Yes   Pain Score 2    Pain Location Foot   Pain Orientation Right;Left   Pain Descriptors / Indicators Aching;Throbbing   Pain Type Chronic pain   Pain Onset More than a month ago   Pain Frequency Constant    Aggravating Factors  Standing.   Pain Relieving Factors Being off feet.            [facility] PT Assessment - 08/15/17 0001      Assessment   Medical Diagnosis Plantar fasciitis bilateral.   Referring Provider Cy Blamer MD.   Onset Date/Surgical Date --  6 months.     Precautions   Precautions None     Balance Screen   Has the patient fallen in the past 6 months No   Has the patient had a decrease in activity level because of a fear of falling?  Yes   Is the patient reluctant to leave their home because of a fear of falling?  No     Home Ec[employer] residence     Prior Function   Level of Independence Independent     Cognition   Overall Cognitive Status Within Functional Limits for tasks assessed     Posture/Postural Control   Posture Comments Bilateral ankles with tendency for pronation left greater than right.  Left navicular bone more prominent ofn left.     ROM / Strength   AROM / PROM / Strength AROM;Strength     AROM   Overall AROM Comments left ankle dorsiflexion with knee extended to neutral  and with knee flexed= 5 degrees.  Right ankle dorsiflexion with knee extended= 10 degrees and with knee flexed= 15 degrees.     Strength   Overall Strength Comments Normal bilateral foot/ankle strength.     Palpation   Palpation comment Tender to palpation lateral aspect of  both heels.     Ambulation/Gait   Gait Comments Decreased stance time left greater than right due to pain.            Objective measurements completed on examination: See above findings.          [facility] Adult PT Treatment/Exercise - 08/15/17 0001      Modalities   Modalities Electrical Stimulation     Electrical Stimulation   Electrical Stimulation Location Bilateral heels.   Electrical Stimulation Action Pre-mod.   Electrical Stimulation Parameters Constant at 80-150 Hz x 15 minutes.   Electrical Stimulation Goals Pain                      PT Long Term Goals - 08/15/17 1038      PT LONG TERM GOAL #1   Title Independent with a HEP.   Time 6   Period Weeks   Status New     PT LONG TERM GOAL #2   Title Normal dorsiflexion bilaterall wiht knees extended and flexed.   Time 6   Period Weeks   Status New     PT LONG TERM GOAL #3   Title Walk a community distance with bilateral foot pain not > 2-3/10.   Time 6   Period Weeks   Status New     PT LONG TERM GOAL #4   Title Normal gait pattern.   Time 6   Period Weeks   Status New                Plan - 08/15/17 1028    Clinical Impression Statement The patient presents with bilateral foot pain left > right.  His left ankle into dorsiflexion is limited.  He has palpable pain over bilateral plantar fascia (lateral bands).  A left heel injection has been helpful as well has bilateral shoe inserts.  His gait is antalgic.  Patient will benefit from skilled physical therapy to address deficits and pain.   History and Personal Factors relevant to plan of care: DM.   Clinical Presentation Stable   Clinical Presentation due to: Injection in left heel helped.   Clinical Decision Making Low   Rehab Potential Good   PT Frequency 2x / week   PT Duration 6 weeks   PT Next Visit Plan U/S and IASTM; Rockerboard.  Gastroc-soleus stretch fotr home.  STW/M to bilateral calf musculature.   Consulted and Agree with Plan of Care Patient      Patient will benefit from skilled therapeutic intervention in order to improve the following deficits and impairments:  Abnormal gait, Pain, Decreased activity tolerance, Decreased range of motion  Visit Diagnosis: Pain in left foot - Plan: PT plan of care cert/re-cert  Pain in right foot - Plan: PT plan of care cert/re-cert  Stiffness of left ankle, not elsewhere classified - Plan: PT plan of care cert/re-cert  Stiffness of right ankle, not elsewhere classified - Plan: PT plan of care  cert/re-cert     Problem List Patient Active Problem List   Diagnosis Date Noted  . DDD (degenerative disc disease), cervical 08/10/2017  . Heel pain, bilateral 03/03/2017  . Plantar fasciitis, bilateral 03/03/2017  .  Idiopathic chronic gout, unspecified site, without tophus (tophi) 11/03/2016  . Primary osteoarthritis of both hands 11/03/2016  . History of Meniere's disease 11/03/2016    Stephfon Bovey, [place] MPT 08/15/2017, 11:11 AM  [facility] [address] [place], [place], [address] Phone: [phone]   Fax:  [phone]  Name: TERIUS JACUINDE MRN: [id number] Date of Birth: [birth year]

## 2017-08-18 ENCOUNTER — Encounter: Payer: Commercial Managed Care - PPO | Admitting: Physical Therapy

## 2017-08-19 ENCOUNTER — Ambulatory Visit: Payer: Commercial Managed Care - PPO | Admitting: *Deleted

## 2017-08-19 DIAGNOSIS — M79671 Pain in right foot: Secondary | ICD-10-CM

## 2017-08-19 DIAGNOSIS — M25672 Stiffness of left ankle, not elsewhere classified: Secondary | ICD-10-CM

## 2017-08-19 DIAGNOSIS — M79672 Pain in left foot: Secondary | ICD-10-CM | POA: Diagnosis not present

## 2017-08-19 DIAGNOSIS — M25671 Stiffness of right ankle, not elsewhere classified: Secondary | ICD-10-CM

## 2017-08-19 NOTE — Therapy (Signed)
[facility] [place], [place], [address] Phone: [phone]   Fax:  [phone]  Physical Therapy Treatment  Patient Details  Name: Steve Bernard MRN: [id number] Date of Birth: August 15, 1966 Referring Provider: Cy Blamer MD.  Encounter Date: 08/19/2017      PT End of Session - 08/19/17 0905    Visit Number 2   Number of Visits 12   Date for PT Re-Evaluation 09/26/17   PT Start Time 0900   PT Stop Time 0950   PT Time Calculation (min) 50 min      Past Medical History:  Diagnosis Date  . GERD (gastroesophageal reflux disease)   . Gout   . Hypertension   . Meniere disease 2017  . Vertigo 2017    Past Surgical History:  Procedure Laterality Date  . ANTERIOR FUSION CERVICAL SPINE N/A 03/2015  . CHOLECYSTECTOMY    . COLONOSCOPY N/A 03/21/2013   Procedure: COLONOSCOPY;  Surgeon: Rogene Houston, MD;  [facility];  Service: Endoscopy;  Laterality: N/A;  955-rescheduled to 7:30am Ann notified pt  . TONSILLECTOMY      There were no vitals filed for this visit.      Subjective Assessment - 08/19/17 0903    Subjective The patient has had ongoing and worsening heel pain which started on the left approximately 6 months ago.  His right foot now hurts.  He has inserts that are helping.  His pain at rest is a 2/10.  His pain can rise to much higher levels though when on hois feet for long periods of time and when wearing his hunting boots.   Pertinent History DM.   How long can you walk comfortably? Short community distances.   Patient Stated Goals Get out of pain.   Currently in Pain? Yes                         [facility] Adult PT Treatment/Exercise - 08/19/17 0001      Exercises   Exercises Ankle     Modalities   Modalities Electrical Stimulation;Ultrasound     Electrical Stimulation   Electrical Stimulation Location Bilateral heels.Premod x 15 mins 80-150hz    Electrical Stimulation  Goals Pain     Ultrasound   Ultrasound Location BIL plantar fascia near heel   Ultrasound Parameters 1.3 w/cm2 x 12 total , 3.3 mhz   Ultrasound Goals Pain     Manual Therapy   Manual Therapy Soft tissue mobilization   Soft tissue mobilization STW/ IASTM to both feet plantar fascia and around lateral aspect  of heels     Ankle Exercises: Standing   Rocker Board 5 minutes  PF/DF x 20, and calf stretching                     PT Long Term Goals - 08/15/17 1038      PT LONG TERM GOAL #1   Title Independent with a HEP.   Time 6   Period Weeks   Status New     PT LONG TERM GOAL #2   Title Normal dorsiflexion bilaterall wiht knees extended and flexed.   Time 6   Period Weeks   Status New     PT LONG TERM GOAL #3   Title Walk a community distance with bilateral foot pain not > 2-3/10.   Time 6   Period Weeks   Status New     PT LONG  TERM GOAL #4   Title Normal gait pattern.   Time 6   Period Weeks   Status New               Plan - 08/19/17 0920    Clinical Impression Statement Pt arrived today and feels that both feet are doing a little better with PT and orthotics. He did well with Rx today, but had notable soreness Bil. heels lateral aspect during STW.  Normal response to modalities.   Clinical Decision Making Low   Rehab Potential Good   PT Frequency 2x / week   PT Duration 6 weeks   PT Next Visit Plan U/S and IASTM; Rockerboard.  Gastroc-soleus stretch fotr home.  STW/M to bilateral calf musculature.   Consulted and Agree with Plan of Care Patient      Patient will benefit from skilled therapeutic intervention in order to improve the following deficits and impairments:  Abnormal gait, Pain, Decreased activity tolerance, Decreased range of motion  Visit Diagnosis: Pain in left foot  Pain in right foot  Stiffness of left ankle, not elsewhere classified  Stiffness of right ankle, not elsewhere classified     Problem List Patient  Active Problem List   Diagnosis Date Noted  . DDD (degenerative disc disease), cervical 08/10/2017  . Heel pain, bilateral 03/03/2017  . Plantar fasciitis, bilateral 03/03/2017  . Idiopathic chronic gout, unspecified site, without tophus (tophi) 11/03/2016  . Primary osteoarthritis of both hands 11/03/2016  . History of Meniere's disease 11/03/2016    Darreon Lutes,CHRIS, PTA 08/19/2017, 12:10 PM  [facility] [address] [place], [place], [address] Phone: [phone]   Fax:  [phone]  Name: Steve Bernard MRN: [id number] Date of Birth: 1966/01/08

## 2017-08-29 ENCOUNTER — Ambulatory Visit: Payer: Commercial Managed Care - PPO | Attending: Rheumatology | Admitting: Physical Therapy

## 2017-08-29 ENCOUNTER — Encounter: Payer: Self-pay | Admitting: Physical Therapy

## 2017-08-29 DIAGNOSIS — M25671 Stiffness of right ankle, not elsewhere classified: Secondary | ICD-10-CM | POA: Insufficient documentation

## 2017-08-29 DIAGNOSIS — M25672 Stiffness of left ankle, not elsewhere classified: Secondary | ICD-10-CM | POA: Diagnosis present

## 2017-08-29 DIAGNOSIS — M79671 Pain in right foot: Secondary | ICD-10-CM | POA: Insufficient documentation

## 2017-08-29 DIAGNOSIS — M79672 Pain in left foot: Secondary | ICD-10-CM | POA: Insufficient documentation

## 2017-08-29 NOTE — Therapy (Signed)
[facility] [place], [place], [address] Phone: [phone]   Fax:  [phone]  Physical Therapy Treatment  Patient Details  Name: Steve Bernard MRN: [id number] Date of Birth: August 07, 1966 Referring Provider: Cy Blamer MD.  Encounter Date: 08/29/2017      PT End of Session - 08/29/17 0938    Visit Number 3   Number of Visits 12   Date for PT Re-Evaluation 09/26/17   PT Start Time 0859   PT Stop Time 0958   PT Time Calculation (min) 59 min   Activity Tolerance Patient tolerated treatment well   Behavior During Therapy [facility] for tasks assessed/performed      Past Medical History:  Diagnosis Date  . GERD (gastroesophageal reflux disease)   . Gout   . Hypertension   . Meniere disease 2017  . Vertigo 2017    Past Surgical History:  Procedure Laterality Date  . ANTERIOR FUSION CERVICAL SPINE N/A 03/2015  . CHOLECYSTECTOMY    . COLONOSCOPY N/A 03/21/2013   Procedure: COLONOSCOPY;  Surgeon: Rogene Houston, MD;  [facility];  Service: Endoscopy;  Laterality: N/A;  955-rescheduled to 7:30am Ann notified pt  . TONSILLECTOMY      There were no vitals filed for this visit.      Subjective Assessment - 08/29/17 0904    Subjective Patient reported relief after treatments thus far   Pertinent History DM.   How long can you walk comfortably? Short community distances.   Patient Stated Goals Get out of pain.   Currently in Pain? Yes   Pain Score 4    Pain Location Heel   Pain Orientation Left;Right  right has more discomfort.   Pain Descriptors / Indicators Discomfort   Pain Type Chronic pain   Pain Onset More than a month ago   Pain Frequency Intermittent   Aggravating Factors  standing and bare foot   Pain Relieving Factors rest                         [facility] Adult PT Treatment/Exercise - 08/29/17 0001      Electrical Stimulation   Electrical Stimulation Location Bilateral  heels.Premod x 15 mins 80-150hz    Electrical Stimulation Goals Pain     Ultrasound   Ultrasound Location bil plantar fascia   Ultrasound Parameters 1.3w/cm2/50%/3.45mhz x84min   Ultrasound Goals Pain     Manual Therapy   Manual Therapy Soft tissue mobilization   Soft tissue mobilization STW/ IASTM to both feet plantar fascia and around lateral aspect  of heels     Ankle Exercises: Standing   Rocker Board 5 minutes     Ankle Exercises: Stretches   Gastroc Stretch 2 reps;20 seconds  bil LE                     PT Long Term Goals - 08/29/17 1517      PT LONG TERM GOAL #1   Title Independent with a HEP.   Time 6   Period Weeks   Status On-going     PT LONG TERM GOAL #2   Title Normal dorsiflexion bilaterall wiht knees extended and flexed.   Time 6   Period Weeks   Status On-going     PT LONG TERM GOAL #3   Title Walk a community distance with bilateral foot pain not > 2-3/10.   Time 6   Period Weeks   Status On-going  PT LONG TERM GOAL #4   Title Normal gait pattern.   Time 6   Period Weeks   Status On-going               Plan - 08/29/17 7353    Clinical Impression Statement Patient reported overall improvement and left heel feels better than right. Patient has had shots in both ankles prior. Patient wearing incerts daily. Patient progressing with less pain and feels relief with therapy currently. Pain discomfort with prolong standing. Goals ongoing.    Rehab Potential Good   PT Frequency 2x / week   PT Duration 6 weeks   PT Next Visit Plan U/S and IASTM; Rockerboard.  Gastroc-soleus stretch fotr home.  STW/M to bilateral calf musculature.   Consulted and Agree with Plan of Care Patient      Patient will benefit from skilled therapeutic intervention in order to improve the following deficits and impairments:  Abnormal gait, Pain, Decreased activity tolerance, Decreased range of motion  Visit Diagnosis: Pain in left foot  Pain in right  foot  Stiffness of left ankle, not elsewhere classified  Stiffness of right ankle, not elsewhere classified     Problem List Patient Active Problem List   Diagnosis Date Noted  . DDD (degenerative disc disease), cervical 08/10/2017  . Heel pain, bilateral 03/03/2017  . Plantar fasciitis, bilateral 03/03/2017  . Idiopathic chronic gout, unspecified site, without tophus (tophi) 11/03/2016  . Primary osteoarthritis of both hands 11/03/2016  . History of Meniere's disease 11/03/2016    Steve Bernard 08/29/2017, 9:59 AM  [facility] [place], [place], [address] Phone: [phone]   Fax:  [phone]  Name: Steve Bernard MRN: [id number] Date of Birth: 14-Jan-1966

## 2017-09-06 ENCOUNTER — Encounter: Payer: Commercial Managed Care - PPO | Admitting: Physical Therapy

## 2017-09-07 ENCOUNTER — Encounter: Payer: Self-pay | Admitting: Physical Therapy

## 2017-09-07 ENCOUNTER — Ambulatory Visit: Payer: Commercial Managed Care - PPO | Admitting: Physical Therapy

## 2017-09-07 DIAGNOSIS — M79672 Pain in left foot: Secondary | ICD-10-CM | POA: Diagnosis not present

## 2017-09-07 DIAGNOSIS — M25671 Stiffness of right ankle, not elsewhere classified: Secondary | ICD-10-CM

## 2017-09-07 DIAGNOSIS — M25672 Stiffness of left ankle, not elsewhere classified: Secondary | ICD-10-CM

## 2017-09-07 DIAGNOSIS — M79671 Pain in right foot: Secondary | ICD-10-CM

## 2017-09-07 NOTE — Therapy (Signed)
[facility] [place], [place], [address] Phone: [phone]   Fax:  [phone]  Physical Therapy Treatment  Patient Details  Name: Steve Bernard MRN: [id number] Date of Birth: May 23, 1966 Referring Provider: Cy Blamer MD.  Encounter Date: 09/07/2017      PT End of Session - 09/07/17 0932    Visit Number 4   Number of Visits 12   Date for PT Re-Evaluation 09/26/17   PT Start Time 0900   PT Stop Time 0944   PT Time Calculation (min) 44 min   Activity Tolerance Patient tolerated treatment well   Behavior During Therapy [facility] for tasks assessed/performed      Past Medical History:  Diagnosis Date  . GERD (gastroesophageal reflux disease)   . Gout   . Hypertension   . Meniere disease 2017  . Vertigo 2017    Past Surgical History:  Procedure Laterality Date  . ANTERIOR FUSION CERVICAL SPINE N/A 03/2015  . CHOLECYSTECTOMY    . COLONOSCOPY N/A 03/21/2013   Procedure: COLONOSCOPY;  Surgeon: Rogene Houston, MD;  [facility];  Service: Endoscopy;  Laterality: N/A;  955-rescheduled to 7:30am Ann notified pt  . TONSILLECTOMY      There were no vitals filed for this visit.      Subjective Assessment - 09/07/17 0904    Subjective Patient reported only increased pain in right foot today   Pertinent History DM.   How long can you walk comfortably? Short community distances.   Patient Stated Goals Get out of pain.   Currently in Pain? Yes   Pain Score 6    Pain Location Heel   Pain Orientation Right   Pain Descriptors / Indicators Discomfort   Pain Type Chronic pain   Pain Onset More than a month ago   Pain Frequency Intermittent   Aggravating Factors  standing   Pain Relieving Factors rest                         [facility] Adult PT Treatment/Exercise - 09/07/17 0001      Electrical Stimulation   Electrical Stimulation Location right heel   Electrical Stimulation Action IFC   Electrical Stimulation Parameters 1-10hz  x14mn   Electrical Stimulation Goals Pain     Ultrasound   Ultrasound Location right plantar fascia   Ultrasound Parameters 1.3w/cm2/50%/3.329m x1080m  Ultrasound Goals Pain     Manual Therapy   Manual Therapy Soft tissue mobilization   Soft tissue mobilization STW/ IASTM to right plantar fascia and around lateral aspect of heel                     PT Long Term Goals - 09/07/17 0935      PT LONG TERM GOAL #1   Title Independent with a HEP.   Time 6   Period Weeks   Status On-going     PT LONG TERM GOAL #2   Title Normal dorsiflexion bilaterall wiht knees extended and flexed.   Time 6   Period Weeks   Status On-going     PT LONG TERM GOAL #3   Title Walk a community distance with bilateral foot pain not > 2-3/10.   Time 6   Period Weeks   Status Partially Met  no pain in left foot 09/07/17     PT LONG TERM GOAL #4   Title Normal gait pattern.   Time 6   Period  Weeks   Status On-going               Plan - 09/07/17 0936    Clinical Impression Statement Patient tolerated treatment well today. Patient has reported improvement overall and has no pain in left foot any longer. Patient only has c/o discomfort on right heel esp after prolong walking. Patient has palpable pain with stretching plantar fascia and lateral heel. Patient current goals progressing.    Rehab Potential Good   PT Frequency 2x / week   PT Duration 6 weeks   PT Treatment/Interventions Electrical Stimulation;Moist Heat;Ultrasound;Patient/family education;Therapeutic activities;Therapeutic exercise;Passive range of motion;Manual techniques;Vasopneumatic Device   PT Next Visit Plan U/S and IASTM; Rockerboard.  Gastroc-soleus stretch fotr home.  STW/M to right plantar fascia heel and calf musculature.   Consulted and Agree with Plan of Care Patient      Patient will benefit from skilled therapeutic intervention in order to improve the  following deficits and impairments:  Abnormal gait, Pain, Decreased activity tolerance, Decreased range of motion  Visit Diagnosis: Pain in left foot  Pain in right foot  Stiffness of left ankle, not elsewhere classified  Stiffness of right ankle, not elsewhere classified     Problem List Patient Active Problem List   Diagnosis Date Noted  . DDD (degenerative disc disease), cervical 08/10/2017  . Heel pain, bilateral 03/03/2017  . Plantar fasciitis, bilateral 03/03/2017  . Idiopathic chronic gout, unspecified site, without tophus (tophi) 11/03/2016  . Primary osteoarthritis of both hands 11/03/2016  . History of Meniere's disease 11/03/2016    Phillips Climes, PTA 09/07/2017, 9:49 AM  [facility] [place], [place], [address] Phone: [phone]   Fax:  [phone]  Name: Steve Bernard MRN: [id number] Date of Birth: Sep 05, 1966

## 2017-09-09 ENCOUNTER — Ambulatory Visit: Payer: Commercial Managed Care - PPO | Admitting: *Deleted

## 2017-09-09 DIAGNOSIS — M25671 Stiffness of right ankle, not elsewhere classified: Secondary | ICD-10-CM

## 2017-09-09 DIAGNOSIS — M25672 Stiffness of left ankle, not elsewhere classified: Secondary | ICD-10-CM

## 2017-09-09 DIAGNOSIS — M79672 Pain in left foot: Secondary | ICD-10-CM | POA: Diagnosis not present

## 2017-09-09 DIAGNOSIS — M79671 Pain in right foot: Secondary | ICD-10-CM

## 2017-09-09 NOTE — Therapy (Signed)
[facility] [place], [place], [address] Phone: [phone]   Fax:  [phone]  Physical Therapy Treatment  Patient Details  Name: Steve Bernard MRN: [id number] Date of Birth: May 19, 1966 Referring Provider: Cy Blamer MD.  Encounter Date: 09/09/2017    Past Medical History:  Diagnosis Date  . GERD (gastroesophageal reflux disease)   . Gout   . Hypertension   . Meniere disease 2017  . Vertigo 2017    Past Surgical History:  Procedure Laterality Date  . ANTERIOR FUSION CERVICAL SPINE N/A 03/2015  . CHOLECYSTECTOMY    . COLONOSCOPY N/A 03/21/2013   Procedure: COLONOSCOPY;  Surgeon: Rogene Houston, MD;  [facility];  Service: Endoscopy;  Laterality: N/A;  955-rescheduled to 7:30am Ann notified pt  . TONSILLECTOMY      There were no vitals filed for this visit.      Subjective Assessment - 09/09/17 1252    Subjective Patient reported only increased pain in right foot today   Pertinent History DM.   How long can you walk comfortably? Short community distances.   Patient Stated Goals Get out of pain.   Currently in Pain? Yes   Pain Score 6    Pain Type Chronic pain   Pain Onset More than a month ago                         [facility] Adult PT Treatment/Exercise - 09/09/17 0001      Electrical Stimulation   Electrical Stimulation Location RT  heel.Premod x 15 mins 80-_0    Electrical Stimulation Goals Pain     Ultrasound   Ultrasound Location RT heel lateral aspect   Ultrasound Parameters 1.3 w/cm2 x 10 mins 3.3 mhz   Ultrasound Goals Pain     Manual Therapy   Manual Therapy Soft tissue mobilization   Soft tissue mobilization STW/ IASTM to right plantar fascia and around lateral aspect of heel                     PT Long Term Goals - 09/09/17 1249      PT LONG TERM GOAL #3   Title Walk a community distance with bilateral foot pain not > 2-3/10.   Baseline  --  MET for RT foot.   Time 6   Period Weeks   Status Partially Met               Plan - 09/09/17 1246    Clinical Impression Statement Pt arrived today doing fairly well with decreased pain in both feet. He feels RT foot is doing ok and wanted Rx to focus on LT heel pain. Rx consisted of STW, [place], Stretching and modalities for pain. His CC is pain around lateral aspect of RT heel after prolonged walking. LTG for pain during gait was partially met. Met for RT foot.   Rehab Potential Good   PT Frequency 2x / week   PT Duration 6 weeks   PT Treatment/Interventions Electrical Stimulation;Moist Heat;Ultrasound;Patient/family education;Therapeutic activities;Therapeutic exercise;Passive range of motion;Manual techniques;Vasopneumatic Device   PT Next Visit Plan U/S and IASTM; Rockerboard.  Gastroc-soleus stretch fotr home.  STW/M to right plantar fascia heel and calf musculature.   Consulted and Agree with Plan of Care Patient      Patient will benefit from skilled therapeutic intervention in order to improve the following deficits and impairments:  Abnormal gait, Pain, Decreased activity tolerance, Decreased range  of motion  Visit Diagnosis: Pain in left foot  Pain in right foot  Stiffness of left ankle, not elsewhere classified  Stiffness of right ankle, not elsewhere classified     Problem List Patient Active Problem List   Diagnosis Date Noted  . DDD (degenerative disc disease), cervical 08/10/2017  . Heel pain, bilateral 03/03/2017  . Plantar fasciitis, bilateral 03/03/2017  . Idiopathic chronic gout, unspecified site, without tophus (tophi) 11/03/2016  . Primary osteoarthritis of both hands 11/03/2016  . History of Meniere's disease 11/03/2016    Gunther Zawadzki,CHRIS, PTA 09/09/2017, 12:54 PM  [facility] [address] [place], [place], [address] Phone: [phone]   Fax:  [phone]  Name: Steve Bernard MRN:  [id number] Date of Birth: 07-Jun-1966

## 2017-09-13 ENCOUNTER — Encounter: Payer: Commercial Managed Care - PPO | Admitting: Physical Therapy

## 2017-09-15 ENCOUNTER — Ambulatory Visit: Payer: Commercial Managed Care - PPO | Admitting: Physical Therapy

## 2017-09-15 ENCOUNTER — Encounter: Payer: Self-pay | Admitting: Physical Therapy

## 2017-09-15 DIAGNOSIS — M79672 Pain in left foot: Secondary | ICD-10-CM | POA: Diagnosis not present

## 2017-09-15 DIAGNOSIS — M79671 Pain in right foot: Secondary | ICD-10-CM

## 2017-09-15 DIAGNOSIS — M25671 Stiffness of right ankle, not elsewhere classified: Secondary | ICD-10-CM

## 2017-09-15 DIAGNOSIS — M25672 Stiffness of left ankle, not elsewhere classified: Secondary | ICD-10-CM

## 2017-09-15 NOTE — Therapy (Addendum)
[facility] [place], [place], [address] Phone: [phone]   Fax:  [phone]  Physical Therapy Treatment  Patient Details  Name: Steve Bernard MRN: [id number] Date of Birth: May 06, 1966 Referring Provider: Cy Blamer MD.  Encounter Date: 09/15/2017      PT End of Session - 09/15/17 0735    Visit Number 5   Number of Visits 12   Date for PT Re-Evaluation 09/26/17   PT Start Time 0735   PT Stop Time 0819   PT Time Calculation (min) 44 min   Activity Tolerance Patient tolerated treatment well   Behavior During Therapy [facility] for tasks assessed/performed      Past Medical History:  Diagnosis Date  . GERD (gastroesophageal reflux disease)   . Gout   . Hypertension   . Meniere disease 2017  . Vertigo 2017    Past Surgical History:  Procedure Laterality Date  . ANTERIOR FUSION CERVICAL SPINE N/A 03/2015  . CHOLECYSTECTOMY    . COLONOSCOPY N/A 03/21/2013   Procedure: COLONOSCOPY;  Surgeon: Rogene Houston, MD;  [facility];  Service: Endoscopy;  Laterality: N/A;  955-rescheduled to 7:30am Ann notified pt  . TONSILLECTOMY      There were no vitals filed for this visit.      Subjective Assessment - 09/15/17 0734    Subjective Reports increased pain with more weightbearing through R heel. Reports that greatest pain comes from prolonged standing.   Pertinent History DM.   How long can you walk comfortably? Short community distances.   Patient Stated Goals Get out of pain.   Currently in Pain? Yes   Pain Score 4    Pain Location Heel   Pain Orientation Right   Pain Descriptors / Indicators Pressure   Pain Type Chronic pain   Pain Onset More than a month ago                         [facility] Adult PT Treatment/Exercise - 09/15/17 0001      Modalities   Modalities Electrical Stimulation;Ultrasound     Acupuncturist Stimulation Location R heel   Electrical  Stimulation Action Pre-Mod   Electrical Stimulation Parameters 80-150 hz x15 min   Electrical Stimulation Goals Pain     Ultrasound   Ultrasound Location R mid to lateral heel/ PF   Ultrasound Parameters 1.5 w/cm2, 100%, 1 mhz x10 min   Ultrasound Goals Pain     Manual Therapy   Manual Therapy Soft tissue mobilization   Soft tissue mobilization IASTW to R heel/ PF to reduce tightness and pain                     PT Long Term Goals - 09/15/17 7564      PT LONG TERM GOAL #1   Title Independent with a HEP.   Time 6   Period Weeks   Status Achieved     PT LONG TERM GOAL #2   Title Normal dorsiflexion bilaterall wiht knees extended and flexed.   Time 6   Period Weeks   Status On-going     PT LONG TERM GOAL #3   Title Walk a community distance with bilateral foot pain not > 2-3/10.   Baseline --  MET for RT foot.   Time 6   Period Weeks   Status Achieved     PT LONG TERM GOAL #4  Title Normal gait pattern.   Time 6   Period Weeks   Status Not Met               Plan - 09/15/17 0845    Clinical Impression Statement Patient requests to be put on hold as he sees some improvement but still has pain with prolonged standing. Patient still tender at mid to lateral R heel at this time. Still ambulating with an antalgic gait secondary to pain. Normal modalities response noted following removal of the modalities.   Rehab Potential Good   PT Frequency 2x / week   PT Duration 6 weeks   PT Treatment/Interventions Electrical Stimulation;Moist Heat;Ultrasound;Patient/family education;Therapeutic activities;Therapeutic exercise;Passive range of motion;Manual techniques;Vasopneumatic Device   PT Next Visit Plan U/S and IASTM; Rockerboard.  Gastroc-soleus stretch fotr home.  STW/M to right plantar fascia heel and calf musculature.   Consulted and Agree with Plan of Care Patient      Patient will benefit from skilled therapeutic intervention in order to improve the  following deficits and impairments:  Abnormal gait, Pain, Decreased activity tolerance, Decreased range of motion  Visit Diagnosis: Pain in left foot  Pain in right foot  Stiffness of left ankle, not elsewhere classified  Stiffness of right ankle, not elsewhere classified     Problem List Patient Active Problem List   Diagnosis Date Noted  . DDD (degenerative disc disease), cervical 08/10/2017  . Heel pain, bilateral 03/03/2017  . Plantar fasciitis, bilateral 03/03/2017  . Idiopathic chronic gout, unspecified site, without tophus (tophi) 11/03/2016  . Primary osteoarthritis of both hands 11/03/2016  . History of Meniere's disease 11/03/2016   Ahmed Prima, PTA 09/15/17 8:54 AM  [facility] [address] [place], [place], [address] Phone: [phone]   Fax:  [phone]  Name: Steve Bernard MRN: [id number] Date of Birth: [birth year]  PHYSICAL THERAPY DISCHARGE SUMMARY  Visits from Start of Care: 5.  Current functional level related to goals / functional outcomes: See above.   Remaining deficits: See goal section.   Education / Equipment: HEP. Plan: Patient agrees to discharge.  Patient goals were partially met. Patient is being discharged due to                                                     ?????         Steve Bernard MPT

## 2018-01-23 NOTE — Progress Notes (Deleted)
Office Visit Note  Patient: Steve Bernard             Date of Birth: December 05, 1965           MRN: [id number]             PCP: Glenda Chroman, MD Referring: Glenda Chroman, MD Visit Date: 02/06/2018 Occupation: @GUAROCC @    Subjective:  No chief complaint on file.   History of Present Illness: Steve Bernard is a 52 y.o. male ***   Activities of Daily Living:  Patient reports morning stiffness for *** {minute/hour:19697}.   Patient {ACTIONS;DENIES/REPORTS:21021675::"Denies"} nocturnal pain.  Difficulty dressing/grooming: {ACTIONS;DENIES/REPORTS:21021675::"Denies"} Difficulty climbing stairs: {ACTIONS;DENIES/REPORTS:21021675::"Denies"} Difficulty getting out of chair: {ACTIONS;DENIES/REPORTS:21021675::"Denies"} Difficulty using hands for taps, buttons, cutlery, and/or writing: {ACTIONS;DENIES/REPORTS:21021675::"Denies"}   No Rheumatology ROS completed.   PMFS History:  Patient Active Problem List   Diagnosis Date Noted  . DDD (degenerative disc disease), cervical 08/10/2017  . Heel pain, bilateral 03/03/2017  . Plantar fasciitis, bilateral 03/03/2017  . Idiopathic chronic gout, unspecified site, without tophus (tophi) 11/03/2016  . Primary osteoarthritis of both hands 11/03/2016  . History of Meniere's disease 11/03/2016    Past Medical History:  Diagnosis Date  . GERD (gastroesophageal reflux disease)   . Gout   . Hypertension   . Meniere disease 2017  . Vertigo 2017    Family History  Problem Relation Age of Onset  . Arthritis/Rheumatoid Mother   . Diabetes Mother   . Heart disease Father   . Arthritis/Rheumatoid Maternal Aunt    Past Surgical History:  Procedure Laterality Date  . ANTERIOR FUSION CERVICAL SPINE N/A 03/2015  . CHOLECYSTECTOMY    . COLONOSCOPY N/A 03/21/2013   Procedure: COLONOSCOPY;  Surgeon: Rogene Houston, MD;  [facility];  Service: Endoscopy;  Laterality: N/A;  955-rescheduled to 7:30am Ann notified pt  . TONSILLECTOMY     Social  History   Social History Narrative  . Not on file     Objective: Vital Signs: There were no vitals taken for this visit.   Physical Exam   Musculoskeletal Exam: ***  CDAI Exam: No CDAI exam completed.    Investigation: No additional findings.Uric acid: 08/10/2017 7.5 CBC Latest Ref Rng & Units 08/10/2017 03/09/2017 01/31/2011  WBC 3.8 - 10.8 Thousand/uL 7.3 7.9 7.3  Hemoglobin 13.2 - 17.1 g/dL 14.7 15.1 16.0  Hematocrit 38.5 - 50.0 % 42.2 44.8 45.2  Platelets 140 - 400 Thousand/uL 272 282 228   CMP Latest Ref Rng & Units 08/10/2017 03/09/2017 04/05/2016  Glucose 65 - 99 mg/dL 212(H) 141(H) -  BUN 7 - 25 mg/dL 17 17 -  Creatinine 0.70 - 1.33 mg/dL 1.10 1.09 0.90  Sodium 135 - 146 mmol/L 138 140 -  Potassium 3.5 - 5.3 mmol/L 4.3 4.4 -  Chloride 98 - 110 mmol/L 100 99 -  CO2 20 - 32 mmol/L 28 27 -  Calcium 8.6 - 10.3 mg/dL 9.3 10.0 -  Total Protein 6.1 - 8.1 g/dL 6.9 7.3 -  Total Bilirubin 0.2 - 1.2 mg/dL 0.8 1.0 -  Alkaline Phos 40 - 115 U/L - 78 -  AST 10 - 35 U/L 26 28 -  ALT 9 - 46 U/L 22 25 -    Imaging: No results found.  Speciality Comments: No specialty comments available.    Procedures:  No procedures performed Allergies: Patient has no known allergies.   Assessment / Plan:     Visit Diagnoses: No diagnosis found.  Orders: No orders of the defined types were placed in this encounter.  No orders of the defined types were placed in this encounter.   Face-to-face time spent with patient was *** minutes. 50% of time was spent in counseling and coordination of care.  Follow-Up Instructions: No Follow-up on file.   Earnestine Mealing, CMA  Note - This record has been created using Editor, commissioning.  Chart creation errors have been sought, but may not always  have been located. Such creation errors do not reflect on  the standard of medical care.

## 2018-02-06 ENCOUNTER — Ambulatory Visit: Payer: Commercial Managed Care - PPO | Admitting: Rheumatology

## 2018-03-16 ENCOUNTER — Encounter (INDEPENDENT_AMBULATORY_CARE_PROVIDER_SITE_OTHER): Payer: Self-pay | Admitting: *Deleted

## 2018-04-11 ENCOUNTER — Encounter (INDEPENDENT_AMBULATORY_CARE_PROVIDER_SITE_OTHER): Payer: Self-pay | Admitting: *Deleted

## 2018-04-13 ENCOUNTER — Other Ambulatory Visit (INDEPENDENT_AMBULATORY_CARE_PROVIDER_SITE_OTHER): Payer: Self-pay | Admitting: *Deleted

## 2018-04-13 DIAGNOSIS — Z860100 Personal history of colon polyps, unspecified: Secondary | ICD-10-CM | POA: Insufficient documentation

## 2018-05-23 ENCOUNTER — Telehealth (INDEPENDENT_AMBULATORY_CARE_PROVIDER_SITE_OTHER): Payer: Self-pay | Admitting: *Deleted

## 2018-05-23 ENCOUNTER — Encounter (INDEPENDENT_AMBULATORY_CARE_PROVIDER_SITE_OTHER): Payer: Self-pay | Admitting: *Deleted

## 2018-05-23 MED ORDER — PEG 3350-KCL-NA BICARB-NACL 420 G PO SOLR: 4000.0000 mL | Freq: Once | ORAL | 0 refills | Status: AC

## 2018-05-23 NOTE — Telephone Encounter (Signed)
Patient needs trilyte 

## 2018-05-30 ENCOUNTER — Telehealth (INDEPENDENT_AMBULATORY_CARE_PROVIDER_SITE_OTHER): Payer: Self-pay | Admitting: *Deleted

## 2018-05-30 NOTE — Telephone Encounter (Signed)
agree

## 2018-05-30 NOTE — Telephone Encounter (Signed)
Referring MD/PCP: vyas   Procedure: tcs  Reason/Indication:  Hx polyps  Has patient had this procedure before?  Yes, 2014  If so, when, by whom and where?    Is there a family history of colon cancer?  no  Who?  What age when diagnosed?    Is patient diabetic?   yes      Does patient have prosthetic heart valve or mechanical valve?  no  Do you have a pacemaker?  no  Has patient ever had endocarditis? no  Has patient had joint replacement within last 12 months?  no  Is patient constipated or do they take laxatives? no  Does patient have a history of alcohol/drug use?  no  Is patient on blood thinner such as Coumadin, Plavix and/or Aspirin? no  Medications: see epic  Allergies: nkda  Medication Adjustment per Dr Lindi Adie, NP: iron 10 days, hold metformin evening before and morning of procedure  Procedure date & time: 06/29/18 at 830

## 2018-06-29 ENCOUNTER — Ambulatory Visit (HOSPITAL_COMMUNITY)
Admission: RE | Admit: 2018-06-29 | Payer: Commercial Managed Care - PPO | Source: Ambulatory Visit | Admitting: Internal Medicine

## 2018-06-29 ENCOUNTER — Encounter (HOSPITAL_COMMUNITY): Admission: RE | Payer: Self-pay | Source: Ambulatory Visit

## 2018-06-29 SURGERY — COLONOSCOPY
Anesthesia: Moderate Sedation

## 2020-08-13 ENCOUNTER — Other Ambulatory Visit (HOSPITAL_COMMUNITY): Payer: Self-pay | Admitting: Specialist

## 2020-08-13 DIAGNOSIS — R1319 Other dysphagia: Secondary | ICD-10-CM

## 2020-08-19 ENCOUNTER — Ambulatory Visit (HOSPITAL_COMMUNITY): Payer: Medicare HMO | Attending: Internal Medicine | Admitting: Speech Pathology

## 2020-08-19 ENCOUNTER — Other Ambulatory Visit: Payer: Self-pay

## 2020-08-19 ENCOUNTER — Encounter (HOSPITAL_COMMUNITY): Payer: Self-pay | Admitting: Speech Pathology

## 2020-08-19 ENCOUNTER — Ambulatory Visit (HOSPITAL_COMMUNITY)
Admission: RE | Admit: 2020-08-19 | Discharge: 2020-08-19 | Disposition: A | Discharge status: Home / Self Care | Payer: Medicare HMO | Source: Ambulatory Visit | Attending: Internal Medicine | Admitting: Internal Medicine

## 2020-08-19 DIAGNOSIS — R1319 Other dysphagia: Secondary | ICD-10-CM | POA: Diagnosis not present

## 2020-08-19 DIAGNOSIS — R1312 Dysphagia, oropharyngeal phase: Secondary | ICD-10-CM | POA: Insufficient documentation

## 2020-08-19 NOTE — Therapy (Signed)
[place], [place], [address] Phone: [phone]   Fax:  [phone]  Modified Barium Swallow  Patient Details  Name: Steve Bernard MRN: [id number] Date of Birth: 1966-03-01 No data recorded  Encounter Date: 08/19/2020   End of Session - 08/19/20 1631    Visit Number 1    Number of Visits 1    Authorization Type [employer]    SLP Start Time 1325    SLP Stop Time  1400    SLP Time Calculation (min) 35 min    Activity Tolerance Patient tolerated treatment well           Past Medical History:  Diagnosis Date  . GERD (gastroesophageal reflux disease)   . Gout   . Hypertension   . Meniere disease 2017  . Vertigo 2017    Past Surgical History:  Procedure Laterality Date  . ANTERIOR FUSION CERVICAL SPINE N/A 03/2015  . CHOLECYSTECTOMY    . COLONOSCOPY N/A 03/21/2013   Procedure: COLONOSCOPY;  Surgeon: Rogene Houston, MD;  [facility];  Service: Endoscopy;  Laterality: N/A;  955-rescheduled to 7:30am Ann notified pt  . TONSILLECTOMY      There were no vitals filed for this visit.   Subjective Assessment - 08/19/20 1617    Subjective "I've had trouble swallowing breads ever since my ACDF surgery in 2016, but I feel like its happening with more foods now."    Currently in Pain? No/denies               General - 08/19/20 1623      General Information   HPI Steve Bernard is a 54 yo male who was referred by Dr. Jerene Bears for MBSS due to Pt reports of difficulty swallowing solid foods, which has gotten worse in the past few years (ACDF repair 2016). He has trouble with breads and eggs, occasionally liquids and his saliva.    Type of Study MBS-Modified Barium Swallow Study    Previous Swallow Assessment N/A    Diet Prior to this Study Regular;Thin liquids    Temperature Spikes Noted No    Respiratory Status Room air    History of Recent Intubation No    Behavior/Cognition  Alert;Cooperative;Pleasant mood    Oral Cavity Assessment Within Functional Limits    Oral Care Completed by SLP No    Oral Cavity - Dentition Adequate natural dentition    Vision Functional for self feeding    Self-Feeding Abilities Able to feed self    Patient Positioning Upright in chair    Baseline Vocal Quality Normal    Volitional Cough Strong    Volitional Swallow Able to elicit    Anatomy Within functional limits   Evidence of ACDF hardware   Pharyngeal Secretions Not observed secondary MBS              Oral Preparation/Oral Phase - 08/19/20 1626      Oral Preparation/Oral Phase   Oral Phase Within functional limits      Electrical stimulation - Oral Phase   Was Electrical Stimulation Used No            Pharyngeal Phase - 08/19/20 1626      Pharyngeal Phase   Pharyngeal Phase Impaired      Pharyngeal - Thin   Pharyngeal- Thin Teaspoon Swallow initiation at vallecula;Reduced epiglottic inversion    Pharyngeal- Thin Cup Swallow initiation at vallecula;Reduced epiglottic inversion;Pharyngeal residue - valleculae;Pharyngeal  residue - pyriform    Pharyngeal- Thin Straw Swallow initiation at vallecula      Pharyngeal - Solids   Pharyngeal- Puree Swallow initiation at vallecula;Reduced epiglottic inversion;Pharyngeal residue - valleculae    Pharyngeal- Mechanical Soft Swallow initiation at vallecula    Pharyngeal- Regular Delayed swallow initiation-vallecula    Pharyngeal- Pill Within functional limits      Pharyngeal Phase - Comment   Pharyngeal Comment Epiglottis does not completely deflect likely from posterior pharyngeal Beever prominence (ACDF hardware and bony spur) resulting in min vallecular residue post swallow, repeat swallow clears      Electrical Stimulation - Pharyngeal Phase   Was Electrical Stimulation Used No            Cricopharyngeal Phase - 08/19/20 1629      Cervical Esophageal Phase   Cervical Esophageal Phase Within functional limits              Plan - 08/19/20 1632    Clinical Impression Statement Pt presents with min pharyngeal phase dysphagia characterized by reduced epiglottic deflection resulting in min residuals post swallow with liquids and puree necessitating a repeat/dry swallow to clear. An effortful swallow was effective in achieving greater epiglottic deflection with puree and solids. Pt has had previous cervical disc fusion and likely this negatively impacts epiglottic deflection. Recommend regular textures and thin liquids, Pt to swallow 2x for each sip of liquid and "swallow hard" with each swallow of solids. No further SLP services indicated at this time. Recommendations were provided to Pt and questions answered.    Consulted and Agree with Plan of Care Patient           Patient will benefit from skilled therapeutic intervention in order to improve the following deficits and impairments:   Dysphagia, oropharyngeal phase     Recommendations/Treatment - 08/19/20 1629      Swallow Evaluation Recommendations   SLP Diet Recommendations Age appropriate regular;Thin    Liquid Administration via Cup;Straw    Medication Administration Whole meds with liquid    Supervision Patient able to self feed    Compensations Effortful swallow;Multiple dry swallows after each bite/sip    Postural Changes Seated upright at 90 degrees;Remain upright for at least 30 minutes after feeds/meals            Prognosis - 08/19/20 1630      Prognosis   Prognosis for Safe Diet Advancement Good    Barriers to Reach Goals Other (Comment)   previous ACDF hardware     Individuals Consulted   Consulted and Agree with Results and Recommendations Patient    Report Sent to  Referring physician           Problem List Patient Active Problem List   Diagnosis Date Noted  . Hx of colonic polyps 04/13/2018  . DDD (degenerative disc disease), cervical 08/10/2017  . Heel pain, bilateral 03/03/2017  . Plantar fasciitis,  bilateral 03/03/2017  . Idiopathic chronic gout, unspecified site, without tophus (tophi) 11/03/2016  . Primary osteoarthritis of both hands 11/03/2016  . History of Meniere's disease 11/03/2016   Thank you,  Genene Churn, [place]   [facility] 08/19/2020, 4:37 PM  [place] [address] [place], [place], [address] Phone: [phone]   Fax:  [phone]  Name: LAVANCE BEAZER MRN: [id number] Date of Birth: 1966-07-22

## 2020-10-14 ENCOUNTER — Encounter (INDEPENDENT_AMBULATORY_CARE_PROVIDER_SITE_OTHER): Payer: Self-pay | Admitting: Gastroenterology

## 2021-01-12 ENCOUNTER — Encounter (INDEPENDENT_AMBULATORY_CARE_PROVIDER_SITE_OTHER): Payer: Self-pay | Admitting: Gastroenterology

## 2021-01-12 ENCOUNTER — Other Ambulatory Visit (INDEPENDENT_AMBULATORY_CARE_PROVIDER_SITE_OTHER): Payer: Self-pay

## 2021-01-12 ENCOUNTER — Ambulatory Visit (INDEPENDENT_AMBULATORY_CARE_PROVIDER_SITE_OTHER): Payer: Medicare HMO | Admitting: Gastroenterology

## 2021-01-12 ENCOUNTER — Telehealth (INDEPENDENT_AMBULATORY_CARE_PROVIDER_SITE_OTHER): Payer: Self-pay

## 2021-01-12 ENCOUNTER — Encounter (INDEPENDENT_AMBULATORY_CARE_PROVIDER_SITE_OTHER): Payer: Self-pay

## 2021-01-12 ENCOUNTER — Other Ambulatory Visit: Payer: Self-pay

## 2021-01-12 VITALS — BP 116/72 | HR 79 | Temp 97.9°F | Ht 71.0 in | Wt 295.1 lb

## 2021-01-12 DIAGNOSIS — K219 Gastro-esophageal reflux disease without esophagitis: Secondary | ICD-10-CM | POA: Diagnosis not present

## 2021-01-12 DIAGNOSIS — Z86010 Personal history of colonic polyps: Secondary | ICD-10-CM

## 2021-01-12 DIAGNOSIS — T17308A Unspecified foreign body in larynx causing other injury, initial encounter: Secondary | ICD-10-CM

## 2021-01-12 DIAGNOSIS — Z1211 Encounter for screening for malignant neoplasm of colon: Secondary | ICD-10-CM

## 2021-01-12 MED ORDER — NA SULFATE-K SULFATE-MG SULF 17.5-3.13-1.6 GM/177ML PO SOLN: 354.0000 mL | Freq: Once | ORAL | 0 refills | Status: AC

## 2021-01-12 NOTE — Progress Notes (Signed)
Steve Bernard, M.D. Gastroenterology & Hepatology [facility] [address] [place], [place] [address] Primary Care Physician: Glenda Chroman, MD [address] [place] [address]  Referring MD: PCP  Chief Complaint: Choking episodes  History of Present Illness: Steve Bernard is a 55 y.o. male with PMH GERD, HTN, gout, Menieres disease, DM, who presents for evaluation of choking episodes.  Patient reports that in 2016 after undergoing a cervical disc surgery, he noticed worsening episodes of choking after swallowing specific kind of food, usually bread or meat but other solids also caused this. He has also noticed some liquids can cause these symptoms but these are less frequent. Denies any episodes of food impaction. He denies any dysphagia or odynophagia but reports that he has to cough several times to help the food go down.  States that his GERD is well controlled while taking the Nexium but if he skips a dose he will have significant symptoms of heartburn.  The patient denies having any odynophagia, nausea, vomiting, fever, chills, hematochezia, melena, hematemesis, abdominal distention, abdominal pain, diarrhea, jaundice, pruritus or weight loss.  Most recent imaging was performed on 08/11/2020, he underwent a modified barium swallow, was found to have incomplete epiglottic inversion which persisted despite swallows.  There was presence of minimal vallecular residuals with the intake of cracker but the patient was able to swallow a 12.5 mm diameter barium tablet without difficulty.  Based on recommendations from speech and swallow therapist, the patient has a greater epiglottic deflection with pure and solids which likely is the result of previous cervical disc fusion affecting the epiglottic deflection.  He was recommended to take regular textures and thin liquids, and doing forceful swallows. He states that following these recommendations  he has felt slightly better.  Last EGD: 2012 -  Esophagitis  Last Colonoscopy: 2014 Normal mucosa of terminal ileum. Polyp at proximal transverse colon was ablated via cold biopsy and submitted together with a small polyp that was all stated from distal sigmoid colon.  Pathology consistent with a tubular adenoma and a sessile serrated lesion. 7 mm polyp snared from distal sigmoid colon another 5 mm polyp snared from rectosigmoid junction. These polyps were cemented together.  Pathology consistent with tubular adenoma and hyperplastic polyp Normal rectal mucosa and anal rectal junction.  FHx: neg for any gastrointestinal/liver disease, no malignancies Social: quit smoking 25 years ago, neg alcohol or illicit drug use Surgical: cholecystectomy  Past Medical History: Past Medical History:  Diagnosis Date  . GERD (gastroesophageal reflux disease)   . Gout   . Hypertension   . Meniere disease 2017  . Vertigo 2017    Past Surgical History: Past Surgical History:  Procedure Laterality Date  . ANTERIOR FUSION CERVICAL SPINE N/A 03/2015  . CHOLECYSTECTOMY    . COLONOSCOPY N/A 03/21/2013   Procedure: COLONOSCOPY;  Surgeon: Rogene Houston, MD;  [facility];  Service: Endoscopy;  Laterality: N/A;  955-rescheduled to 7:30am Ann notified pt  . TONSILLECTOMY      Family History: Family History  Problem Relation Age of Onset  . Arthritis/Rheumatoid Mother   . Diabetes Mother   . Heart disease Father   . Arthritis/Rheumatoid Maternal Aunt     Social History: Social History   Tobacco Use  Smoking Status Never Smoker  Smokeless Tobacco Never Used   Social History   Substance and Sexual Activity  Alcohol Use No   Social History   Substance and Sexual Activity  Drug Use No    Allergies: Allergies  Allergen Reactions  . Lisinopril Cough    Medications: Current Outpatient Medications  Medication Sig Dispense Refill  . allopurinol (ZYLOPRIM) 300 MG tablet  Take 1 tablet (300 mg total) by mouth daily. 90 tablet 1  . Ascorbic Acid (VITAMIN C) 500 MG CAPS Take 500 mg by mouth. Patient reports that he takes 3 times a week.    Marland Kitchen atorvastatin (LIPITOR) 10 MG tablet Take 40 mg by mouth at bedtime.    . chlorthalidone (HYGROTON) 25 MG tablet Take 25 mg by mouth. Patient takes every Sunday , Monday, Tuesday, Thursday, Saturday.  2  . colchicine 0.6 MG tablet Take 1 tablet (0.6 mg total) by mouth daily. 30 tablet 2  . diazepam (VALIUM) 2 MG tablet Take by mouth. Patient may will take 2 mg up to 4 tabs in 24 hours for Vertigo as needed.  0  . esomeprazole (NEXIUM) 40 MG capsule Take 40 mg by mouth daily before breakfast.    . fluticasone (FLONASE) 50 MCG/ACT nasal spray Place 2 sprays into the nose daily.    . Magnesium 250 MG TABS Take 250 mg by mouth. Patient reports that he takes by mouth 3 times a week,    . metFORMIN (GLUCOPHAGE) 500 MG tablet Take 500 mg by mouth. PATIENT TAKES 500 MG EVERY MORNING AND 1000 MG AT BEDTIME.  2  . metoprolol (LOPRESSOR) 50 MG tablet Take 50 mg by mouth daily.    . Multiple Vitamin (MULTI-DAY PO) Take by mouth daily.    . Multiple Vitamins-Minerals (ZINC PO) Take 50 mg by mouth. Patient reports that he takes 4 times a week.    . naphazoline-pheniramine (NAPHCON-A) 0.025-0.3 % ophthalmic solution Place 1 drop into both eyes 4 (four) times daily as needed (Itchy Eyes).    . ondansetron (ZOFRAN-ODT) 4 MG disintegrating tablet Take 4 mg by mouth daily as needed.    . Semaglutide,0.25 or 0.5MG /DOS, 2 MG/1.5ML SOPN Inject 0.25 mg into the skin once a week.    Marland Kitchen tiZANidine (ZANAFLEX) 4 MG tablet Take 4 mg by mouth.    . valsartan (DIOVAN) 80 MG tablet Take by mouth.    . IRON PO Take by mouth. (Patient not taking: Reported on 01/12/2021)     No current facility-administered medications for this visit.    Review of Systems: GENERAL: negative for malaise, night sweats HEENT: No changes in hearing or vision, no nose bleeds or  other nasal problems. NECK: Negative for lumps, goiter, pain and significant neck swelling RESPIRATORY: Negative for cough, wheezing CARDIOVASCULAR: Negative for chest pain, leg swelling, palpitations, orthopnea GI: SEE HPI MUSCULOSKELETAL: Negative for joint pain or swelling, back pain, and muscle pain. SKIN: Negative for lesions, rash PSYCH: Negative for sleep disturbance, mood disorder and recent psychosocial stressors. HEMATOLOGY Negative for prolonged bleeding, bruising easily, and swollen nodes. ENDOCRINE: Negative for cold or heat intolerance, polyuria, polydipsia and goiter. NEURO: negative for tremor, gait imbalance, syncope and seizures. The remainder of the review of systems is noncontributory.   Physical Exam: BP 116/72 (BP Location: Left Arm, Patient Position: Sitting, Cuff Size: Large)   Pulse 79   Temp 97.9 F (36.6 C) (Oral)   Ht 5\' 11"  (1.803 m)   Wt 295 lb 1.3 oz (133.8 kg)   BMI 41.16 kg/m  GENERAL: The patient is AO x3, in no acute distress. HEENT: Head is normocephalic and atraumatic. EOMI are intact. Mouth is well hydrated and without lesions. NECK: Supple.  No masses LUNGS: Clear to auscultation. No presence of rhonchi/wheezing/rales. Adequate chest expansion HEART: RRR, normal s1 and s2. ABDOMEN: Soft, nontender, no guarding, no peritoneal signs, and nondistended. BS +. No masses. EXTREMITIES: Without any cyanosis, clubbing, rash, lesions or edema. NEUROLOGIC: AOx3, no focal motor deficit. SKIN: no jaundice, no rashes   Imaging/Labs: as above  I personally reviewed and interpreted the available labs, imaging and endoscopic files.  Impression and Plan: Steve Bernard is a 55 y.o. male with PMH GERD, HTN, gout, Menieres disease, DM, who presents for evaluation of choking episodes.  Patient has presented chronic symptoms after he had a surgical intervention, which I believe may have led to his current presentation as he may have had some nerve damage leading  to the abnormalities seen in his epiglottis.  However, given the chronicity of symptoms we will explore esophageal causes with an EGD.  I advised the patient that he should follow the instructions provided by the swallow pathologist, he understood and agreed. He must continue his Nexium 40 mg qday for GERD.  He is also due for a repeat colonoscopy given his history of polyps, we'll schedule a colonoscopy as well.    - Schedule EGD and colonoscopy - Continue Nexium 40 mg qday  All questions were answered.      Steve Peppers, MD Gastroenterology and Hepatology [facility]

## 2021-01-12 NOTE — Patient Instructions (Addendum)
Schedule EGD and colonoscopy Continue Nexium 40 mg qday

## 2021-01-12 NOTE — Telephone Encounter (Signed)
Steve Bernard, CMA  

## 2021-01-12 NOTE — H&P (View-Only) (Signed)
[name], M.D. Gastroenterology & Hepatology [facility] [address] [place], [place] [address] Primary Care Physician: Glenda Chroman, MD [address] [place] [address]  Referring MD: PCP  Chief Complaint: Choking episodes  History of Present Illness: Steve Bernard is a 55 y.o. male with PMH GERD, HTN, gout, Menieres disease, DM, who presents for evaluation of choking episodes.  Patient reports that in 2016 after undergoing a cervical disc surgery, he noticed worsening episodes of choking after swallowing specific kind of food, usually bread or meat but other solids also caused this. He has also noticed some liquids can cause these symptoms but these are less frequent. Denies any episodes of food impaction. He denies any dysphagia or odynophagia but reports that he has to cough several times to help the food go down.  States that his GERD is well controlled while taking the Nexium but if he skips a dose he will have significant symptoms of heartburn.  The patient denies having any odynophagia, nausea, vomiting, fever, chills, hematochezia, melena, hematemesis, abdominal distention, abdominal pain, diarrhea, jaundice, pruritus or weight loss.  Most recent imaging was performed on 08/11/2020, he underwent a modified barium swallow, was found to have incomplete epiglottic inversion which persisted despite swallows.  There was presence of minimal vallecular residuals with the intake of cracker but the patient was able to swallow a 12.5 mm diameter barium tablet without difficulty.  Based on recommendations from speech and swallow therapist, the patient has a greater epiglottic deflection with pure and solids which likely is the result of previous cervical disc fusion affecting the epiglottic deflection.  He was recommended to take regular textures and thin liquids, and doing forceful swallows. He states that following these recommendations  he has felt slightly better.  Last EGD: 2012 -  Esophagitis  Last Colonoscopy: 2014 Normal mucosa of terminal ileum. Polyp at proximal transverse colon was ablated via cold biopsy and submitted together with a small polyp that was all stated from distal sigmoid colon.  Pathology consistent with a tubular adenoma and a sessile serrated lesion. 7 mm polyp snared from distal sigmoid colon another 5 mm polyp snared from rectosigmoid junction. These polyps were cemented together.  Pathology consistent with tubular adenoma and hyperplastic polyp Normal rectal mucosa and anal rectal junction.  FHx: neg for any gastrointestinal/liver disease, no malignancies Social: quit smoking 25 years ago, neg alcohol or illicit drug use Surgical: cholecystectomy  Past Medical History: Past Medical History:  Diagnosis Date  . GERD (gastroesophageal reflux disease)   . Gout   . Hypertension   . Meniere disease 2017  . Vertigo 2017    Past Surgical History: Past Surgical History:  Procedure Laterality Date  . ANTERIOR FUSION CERVICAL SPINE N/A 03/2015  . CHOLECYSTECTOMY    . COLONOSCOPY N/A 03/21/2013   Procedure: COLONOSCOPY;  Surgeon: Rogene Houston, MD;  [facility];  Service: Endoscopy;  Laterality: N/A;  955-rescheduled to 7:30am Ann notified pt  . TONSILLECTOMY      Family History: Family History  Problem Relation Age of Onset  . Arthritis/Rheumatoid Mother   . Diabetes Mother   . Heart disease Father   . Arthritis/Rheumatoid Maternal Aunt     Social History: Social History   Tobacco Use  Smoking Status Never Smoker  Smokeless Tobacco Never Used   Social History   Substance and Sexual Activity  Alcohol Use No   Social History   Substance and Sexual Activity  Drug Use No    Allergies: Allergies  Allergen Reactions  . Lisinopril Cough    Medications: Current Outpatient Medications  Medication Sig Dispense Refill  . allopurinol (ZYLOPRIM) 300 MG tablet  Take 1 tablet (300 mg total) by mouth daily. 90 tablet 1  . Ascorbic Acid (VITAMIN C) 500 MG CAPS Take 500 mg by mouth. Patient reports that he takes 3 times a week.    Marland Kitchen atorvastatin (LIPITOR) 10 MG tablet Take 40 mg by mouth at bedtime.    . chlorthalidone (HYGROTON) 25 MG tablet Take 25 mg by mouth. Patient takes every Sunday , Monday, Tuesday, Thursday, Saturday.  2  . colchicine 0.6 MG tablet Take 1 tablet (0.6 mg total) by mouth daily. 30 tablet 2  . diazepam (VALIUM) 2 MG tablet Take by mouth. Patient may will take 2 mg up to 4 tabs in 24 hours for Vertigo as needed.  0  . esomeprazole (NEXIUM) 40 MG capsule Take 40 mg by mouth daily before breakfast.    . fluticasone (FLONASE) 50 MCG/ACT nasal spray Place 2 sprays into the nose daily.    . Magnesium 250 MG TABS Take 250 mg by mouth. Patient reports that he takes by mouth 3 times a week,    . metFORMIN (GLUCOPHAGE) 500 MG tablet Take 500 mg by mouth. PATIENT TAKES 500 MG EVERY MORNING AND 1000 MG AT BEDTIME.  2  . metoprolol (LOPRESSOR) 50 MG tablet Take 50 mg by mouth daily.    . Multiple Vitamin (MULTI-DAY PO) Take by mouth daily.    . Multiple Vitamins-Minerals (ZINC PO) Take 50 mg by mouth. Patient reports that he takes 4 times a week.    . naphazoline-pheniramine (NAPHCON-A) 0.025-0.3 % ophthalmic solution Place 1 drop into both eyes 4 (four) times daily as needed (Itchy Eyes).    . ondansetron (ZOFRAN-ODT) 4 MG disintegrating tablet Take 4 mg by mouth daily as needed.    . Semaglutide,0.25 or 0.5MG /DOS, 2 MG/1.5ML SOPN Inject 0.25 mg into the skin once a week.    Marland Kitchen tiZANidine (ZANAFLEX) 4 MG tablet Take 4 mg by mouth.    . valsartan (DIOVAN) 80 MG tablet Take by mouth.    . IRON PO Take by mouth. (Patient not taking: Reported on 01/12/2021)     No current facility-administered medications for this visit.    Review of Systems: GENERAL: negative for malaise, night sweats HEENT: No changes in hearing or vision, no nose bleeds or  other nasal problems. NECK: Negative for lumps, goiter, pain and significant neck swelling RESPIRATORY: Negative for cough, wheezing CARDIOVASCULAR: Negative for chest pain, leg swelling, palpitations, orthopnea GI: SEE HPI MUSCULOSKELETAL: Negative for joint pain or swelling, back pain, and muscle pain. SKIN: Negative for lesions, rash PSYCH: Negative for sleep disturbance, mood disorder and recent psychosocial stressors. HEMATOLOGY Negative for prolonged bleeding, bruising easily, and swollen nodes. ENDOCRINE: Negative for cold or heat intolerance, polyuria, polydipsia and goiter. NEURO: negative for tremor, gait imbalance, syncope and seizures. The remainder of the review of systems is noncontributory.   Physical Exam: BP 116/72 (BP Location: Left Arm, Patient Position: Sitting, Cuff Size: Large)   Pulse 79   Temp 97.9 F (36.6 C) (Oral)   Ht 5\' 11"  (1.803 m)   Wt 295 lb 1.3 oz (133.8 kg)   BMI 41.16 kg/m  GENERAL: The patient is AO x3, in no acute distress. HEENT: Head is normocephalic and atraumatic. EOMI are intact. Mouth is well hydrated and without lesions. NECK: Supple.  No masses LUNGS: Clear to auscultation. No presence of rhonchi/wheezing/rales. Adequate chest expansion HEART: RRR, normal s1 and s2. ABDOMEN: Soft, nontender, no guarding, no peritoneal signs, and nondistended. BS +. No masses. EXTREMITIES: Without any cyanosis, clubbing, rash, lesions or edema. NEUROLOGIC: AOx3, no focal motor deficit. SKIN: no jaundice, no rashes   Imaging/Labs: as above  I personally reviewed and interpreted the available labs, imaging and endoscopic files.  Impression and Plan: Steve Bernard is a 55 y.o. male with PMH GERD, HTN, gout, Menieres disease, DM, who presents for evaluation of choking episodes.  Patient has presented chronic symptoms after he had a surgical intervention, which I believe may have led to his current presentation as he may have had some nerve damage leading  to the abnormalities seen in his epiglottis.  However, given the chronicity of symptoms we will explore esophageal causes with an EGD.  I advised the patient that he should follow the instructions provided by the swallow pathologist, he understood and agreed. He must continue his Nexium 40 mg qday for GERD.  He is also due for a repeat colonoscopy given his history of polyps, we'll schedule a colonoscopy as well.    - Schedule EGD and colonoscopy - Continue Nexium 40 mg qday  All questions were answered.      [name], MD Gastroenterology and Hepatology [facility]

## 2021-01-15 ENCOUNTER — Encounter (HOSPITAL_COMMUNITY): Payer: Self-pay | Admitting: Gastroenterology

## 2021-01-19 ENCOUNTER — Other Ambulatory Visit (HOSPITAL_COMMUNITY)
Admission: RE | Admit: 2021-01-19 | Discharge: 2021-01-19 | Disposition: A | Discharge status: Home / Self Care | Payer: Medicare HMO | Source: Ambulatory Visit | Attending: Gastroenterology | Admitting: Gastroenterology

## 2021-01-19 ENCOUNTER — Other Ambulatory Visit: Payer: Self-pay

## 2021-01-19 DIAGNOSIS — Z20822 Contact with and (suspected) exposure to covid-19: Secondary | ICD-10-CM | POA: Insufficient documentation

## 2021-01-19 DIAGNOSIS — Z01812 Encounter for preprocedural laboratory examination: Secondary | ICD-10-CM | POA: Diagnosis present

## 2021-01-19 LAB — BASIC METABOLIC PANEL
Anion gap: 12 (ref 5–15)
BUN: 15 mg/dL (ref 6–20)
CO2: 28 mmol/L (ref 22–32)
Calcium: 9.6 mg/dL (ref 8.9–10.3)
Chloride: 98 mmol/L (ref 98–111)
Creatinine, Ser: 1.05 mg/dL (ref 0.61–1.24)
GFR, Estimated: >60 mL/min (ref >60)
Glucose, Bld: 131 mg/dL — ABNORMAL HIGH (ref 70–99)
Potassium: 3.8 mmol/L (ref 3.5–5.1)
Sodium: 138 mmol/L (ref 135–145)

## 2021-01-19 LAB — SARS CORONAVIRUS 2 (TAT 6-24 HRS): SARS Coronavirus 2: NEGATIVE

## 2021-01-20 ENCOUNTER — Ambulatory Visit (HOSPITAL_COMMUNITY): Payer: Medicare HMO | Admitting: Anesthesiology

## 2021-01-20 ENCOUNTER — Ambulatory Visit (HOSPITAL_COMMUNITY)
Admission: RE | Admit: 2021-01-20 | Discharge: 2021-01-20 | Disposition: A | Discharge status: Home / Self Care | Payer: Medicare HMO | Attending: Gastroenterology | Admitting: Gastroenterology

## 2021-01-20 ENCOUNTER — Other Ambulatory Visit: Payer: Self-pay

## 2021-01-20 ENCOUNTER — Encounter (HOSPITAL_COMMUNITY): Payer: Self-pay | Admitting: Gastroenterology

## 2021-01-20 ENCOUNTER — Encounter (HOSPITAL_COMMUNITY)
Admission: RE | Disposition: A | Discharge status: Home / Self Care | Payer: Self-pay | Source: Home / Self Care | Attending: Gastroenterology

## 2021-01-20 DIAGNOSIS — E119 Type 2 diabetes mellitus without complications: Secondary | ICD-10-CM | POA: Insufficient documentation

## 2021-01-20 DIAGNOSIS — Z09 Encounter for follow-up examination after completed treatment for conditions other than malignant neoplasm: Secondary | ICD-10-CM

## 2021-01-20 DIAGNOSIS — J387 Other diseases of larynx: Secondary | ICD-10-CM | POA: Diagnosis not present

## 2021-01-20 DIAGNOSIS — D122 Benign neoplasm of ascending colon: Secondary | ICD-10-CM | POA: Diagnosis not present

## 2021-01-20 DIAGNOSIS — K635 Polyp of colon: Secondary | ICD-10-CM | POA: Diagnosis not present

## 2021-01-20 DIAGNOSIS — K3189 Other diseases of stomach and duodenum: Secondary | ICD-10-CM | POA: Diagnosis not present

## 2021-01-20 DIAGNOSIS — Z87891 Personal history of nicotine dependence: Secondary | ICD-10-CM | POA: Diagnosis not present

## 2021-01-20 DIAGNOSIS — R0989 Other specified symptoms and signs involving the circulatory and respiratory systems: Secondary | ICD-10-CM | POA: Diagnosis not present

## 2021-01-20 DIAGNOSIS — D125 Benign neoplasm of sigmoid colon: Secondary | ICD-10-CM | POA: Diagnosis not present

## 2021-01-20 DIAGNOSIS — D123 Benign neoplasm of transverse colon: Secondary | ICD-10-CM | POA: Diagnosis not present

## 2021-01-20 DIAGNOSIS — Z1211 Encounter for screening for malignant neoplasm of colon: Secondary | ICD-10-CM | POA: Insufficient documentation

## 2021-01-20 DIAGNOSIS — K317 Polyp of stomach and duodenum: Secondary | ICD-10-CM | POA: Diagnosis not present

## 2021-01-20 DIAGNOSIS — Z981 Arthrodesis status: Secondary | ICD-10-CM | POA: Diagnosis not present

## 2021-01-20 DIAGNOSIS — Z86010 Personal history of colonic polyps: Secondary | ICD-10-CM | POA: Diagnosis not present

## 2021-01-20 DIAGNOSIS — Z79899 Other long term (current) drug therapy: Secondary | ICD-10-CM | POA: Diagnosis not present

## 2021-01-20 DIAGNOSIS — Z7984 Long term (current) use of oral hypoglycemic drugs: Secondary | ICD-10-CM | POA: Diagnosis not present

## 2021-01-20 DIAGNOSIS — K219 Gastro-esophageal reflux disease without esophagitis: Secondary | ICD-10-CM | POA: Diagnosis not present

## 2021-01-20 DIAGNOSIS — M109 Gout, unspecified: Secondary | ICD-10-CM | POA: Insufficient documentation

## 2021-01-20 DIAGNOSIS — I10 Essential (primary) hypertension: Secondary | ICD-10-CM | POA: Insufficient documentation

## 2021-01-20 DIAGNOSIS — Z9049 Acquired absence of other specified parts of digestive tract: Secondary | ICD-10-CM | POA: Insufficient documentation

## 2021-01-20 HISTORY — PX: POLYPECTOMY: SHX5525

## 2021-01-20 HISTORY — PX: ESOPHAGOGASTRODUODENOSCOPY (EGD) WITH PROPOFOL: SHX5813

## 2021-01-20 HISTORY — PX: COLONOSCOPY WITH PROPOFOL: SHX5780

## 2021-01-20 HISTORY — PX: POLYPECTOMY: SHX149

## 2021-01-20 HISTORY — PX: BIOPSY: SHX5522

## 2021-01-20 LAB — GLUCOSE, CAPILLARY: Glucose-Capillary: 124 mg/dL — ABNORMAL HIGH (ref 70–99)

## 2021-01-20 LAB — HM COLONOSCOPY

## 2021-01-20 SURGERY — COLONOSCOPY WITH PROPOFOL
Anesthesia: General

## 2021-01-20 MED ORDER — GLYCOPYRROLATE 0.2 MG/ML IJ SOLN
0.2000 mg | Freq: Once | INTRAMUSCULAR | Status: AC
  Administered 2021-01-20: 0.2 mg via INTRAVENOUS

## 2021-01-20 MED ORDER — LIDOCAINE VISCOUS HCL 2 % MT SOLN
15.0000 mL | Freq: Once | OROMUCOSAL | Status: AC
  Administered 2021-01-20: 15 mL via OROMUCOSAL

## 2021-01-20 MED ORDER — GLYCOPYRROLATE 0.2 MG/ML IJ SOLN
INTRAMUSCULAR | Status: AC
  Filled 2021-01-20: qty 1

## 2021-01-20 MED ORDER — LIDOCAINE VISCOUS HCL 2 % MT SOLN
OROMUCOSAL | Status: AC
  Filled 2021-01-20: qty 15

## 2021-01-20 MED ORDER — LACTATED RINGERS IV SOLN: INTRAVENOUS | Status: DC

## 2021-01-20 MED ORDER — PROPOFOL 500 MG/50ML IV EMUL
INTRAVENOUS | Status: DC | PRN
  Administered 2021-01-20: 150 ug/kg/min via INTRAVENOUS

## 2021-01-20 MED ORDER — PROPOFOL 10 MG/ML IV BOLUS
INTRAVENOUS | Status: DC | PRN
  Administered 2021-01-20: 100 mg via INTRAVENOUS

## 2021-01-20 NOTE — Op Note (Signed)
[facility] Patient Name: Steve Bernard Procedure Date: 01/20/2021 1:17 PM MRN: [id number] Date of Birth: 07/18/1966 Attending MD: [name] ,  CSN: [id number] Age: 55 Admit Type: Outpatient Procedure:                Colonoscopy Indications:              High risk colon cancer surveillance: Personal                            history of colonic polyps Providers:                [name], Gwenlyn Fudge, RN, Aram Candela Referring MD:              Medicines:                Monitored Anesthesia Care Complications:            No immediate complications. Estimated Blood Loss:     Estimated blood loss: none. Procedure:                Pre-Anesthesia Assessment:                           - Prior to the procedure, a History and Physical                            was performed, and patient medications, allergies                            and sensitivities were reviewed. The patient's                            tolerance of previous anesthesia was reviewed.                           - The risks and benefits of the procedure and the                            sedation options and risks were discussed with the                            patient. All questions were answered and informed                            consent was obtained.                           - ASA Grade Assessment: II - A patient with mild                            systemic disease.                           After obtaining informed consent, the colonoscope                            was passed under direct vision. Throughout the  procedure, the patient's blood pressure, pulse, and                            oxygen saturations were monitored continuously. The                            [id number]-HQ190L ([id number]) scope was introduced through                            the anus and advanced to the the cecum, identified                            by appendiceal orifice and ileocecal valve. The                             colonoscopy was performed without difficulty. The                            patient tolerated the procedure well. The quality                            of the bowel preparation was adequate to identify                            polyps 6 mm and larger in size. Scope withdrawal                            time was 16 minutes. Scope In: 1:20:16 PM Scope Out: 1:52:57 PM Scope Withdrawal Time: 0 hours 26 minutes 44 seconds  Total Procedure Duration: 0 hours 32 minutes 41 seconds  Findings:      The perianal and digital rectal examinations were normal.      Three sessile polyps were found in the ascending colon. The polyps were       3 to 6 mm in size. These polyps were removed with a cold snare.       Resection and retrieval were complete.      Two sessile polyps were found in the transverse colon. The polyps were 4       to 5 mm in size. These polyps were removed with a cold snare. Resection       and retrieval were complete.      A 5 mm polyp was found in the sigmoid colon. The polyp was sessile. The       polyp was removed with a cold snare. Resection and retrieval were       complete.      The retroflexed view of the distal rectum and anal verge was normal and       showed no anal or rectal abnormalities. Impression:               - Three 3 to 6 mm polyps in the ascending colon,                            removed with a cold snare. Resected and retrieved.                           -  Two 4 to 5 mm polyps in the transverse colon,                            removed with a cold snare. Resected and retrieved.                           - One 5 mm polyp in the sigmoid colon, removed with                            a cold snare. Resected and retrieved.                           - The distal rectum and anal verge are normal on                            retroflexion view. Moderate Sedation:      Per Anesthesia Care Recommendation:           - Discharge patient to  home (ambulatory).                           - Resume previous diet.                           - Await pathology results.                           - Repeat colonoscopy for surveillance based on                            pathology results. Procedure Code(s):        --- Professional ---                           [phone], Colonoscopy, flexible; with removal of                            tumor(s), polyp(s), or other lesion(s) by snare                            technique Diagnosis Code(s):        --- Professional ---                           K63.5, Polyp of colon                           Z86.010, Personal history of colonic polyps CPT copyright 2019 [employer]. All rights reserved. The codes documented in this report are preliminary and upon coder review may  be revised to meet current compliance requirements. [name], MD [name],  01/20/2021 1:57:55 PM This report has been signed electronically. Number of Addenda: 0

## 2021-01-20 NOTE — Transfer of Care (Signed)
Immediate Anesthesia Transfer of Care Note  Patient: BERWYN BIGLEY  Procedure(s) Performed: COLONOSCOPY WITH PROPOFOL (N/A ) ESOPHAGOGASTRODUODENOSCOPY (EGD) WITH PROPOFOL (N/A ) BIOPSY POLYPECTOMY POLYPECTOMY INTESTINAL  Patient Location: PACU  Anesthesia Type:General  Level of Consciousness: awake, alert  and oriented  Airway & Oxygen Therapy: Patient Spontanous Breathing  Post-op Assessment: Report given to RN and Post -op Vital signs reviewed and stable  Post vital signs: Reviewed and stable  Last Vitals:  Vitals Value Taken Time  BP    Temp    Pulse    Resp    SpO2      Last Pain:  Vitals:   01/20/21 1254  TempSrc:   PainSc: 0-No pain      Patients Stated Pain Goal: 4 (70/96/28 3662)  Complications: No complications documented.

## 2021-01-20 NOTE — Discharge Instructions (Signed)
You are being discharged to home.  Resume your previous diet.  We are waiting for your pathology results. Follow the swallow therapist recommendations.  Your physician has recommended a repeat colonoscopy for surveillance based on pathology results.    Upper Endoscopy, Adult, Care After This sheet gives you information about how to care for yourself after your procedure. Your health care provider may also give you more specific instructions. If you have problems or questions, contact your health care provider. What can I expect after the procedure? After the procedure, it is common to have:  A sore throat.  Mild stomach pain or discomfort.  Bloating.  Nausea. Follow these instructions at home:  Follow instructions from your health care provider about what to eat or drink after your procedure.  Return to your normal activities as told by your health care provider. Ask your health care provider what activities are safe for you.  Take over-the-counter and prescription medicines only as told by your health care provider.  If you were given a sedative during the procedure, it can affect you for several hours. Do not drive or operate machinery until your health care provider says that it is safe.  Keep all follow-up visits as told by your health care provider. This is important.   Contact a health care provider if you have:  A sore throat that lasts longer than one day.  Trouble swallowing. Get help right away if:  You vomit blood or your vomit looks like coffee grounds.  You have: ? A fever. ? Bloody, black, or tarry stools. ? A severe sore throat or you cannot swallow. ? Difficulty breathing. ? Severe pain in your chest or abdomen. Summary  After the procedure, it is common to have a sore throat, mild stomach discomfort, bloating, and nausea.  If you were given a sedative during the procedure, it can affect you for several hours. Do not drive or operate machinery until  your health care provider says that it is safe.  Follow instructions from your health care provider about what to eat or drink after your procedure.  Return to your normal activities as told by your health care provider. This information is not intended to replace advice given to you by your health care provider. Make sure you discuss any questions you have with your health care provider. Document Revised: 11/06/2019 Document Reviewed: 04/10/2018 Elsevier Patient Education  2021 [place].  Colonoscopy, Adult, Care After This sheet gives you information about how to care for yourself after your procedure. Your doctor may also give you more specific instructions. If you have problems or questions, call your doctor. What can I expect after the procedure? After the procedure, it is common to have:  A small amount of blood in your poop (stool) for 24 hours.  Some gas.  Mild cramping or bloating in your belly (abdomen). Follow these instructions at home: Eating and drinking  Drink enough fluid to keep your pee (urine) pale yellow.  Follow instructions from your doctor about what you cannot eat or drink.  Return to your normal diet as told by your doctor. Avoid heavy or fried foods that are hard to digest.   Activity  Rest as told by your doctor.  Do not sit for a long time without moving. Get up to take short walks every 1-2 hours. This is important. Ask for help if you feel weak or unsteady.  Return to your normal activities as told by your doctor. Ask your doctor  what activities are safe for you. To help cramping and bloating:  Try walking around.  Put heat on your belly as told by your doctor. Use the heat source that your doctor recommends, such as a moist heat pack or a heating pad. ? Put a towel between your skin and the heat source. ? Leave the heat on for 20-30 minutes. ? Remove the heat if your skin turns bright red. This is very important if you are unable to feel  pain, heat, or cold. You may have a greater risk of getting burned.   General instructions  If you were given a medicine to help you relax (sedative) during your procedure, it can affect you for many hours. Do not drive or use machinery until your doctor says that it is safe.  For the first 24 hours after the procedure: ? Do not sign important documents. ? Do not drink alcohol. ? Do your daily activities more slowly than normal. ? Eat foods that are soft and easy to digest.  Take over-the-counter or prescription medicines only as told by your doctor.  Keep all follow-up visits as told by your doctor. This is important. Contact a doctor if:  You have blood in your poop 2-3 days after the procedure. Get help right away if:  You have more than a small amount of blood in your poop.  You see large clumps of tissue (blood clots) in your poop.  Your belly is swollen.  You feel like you may vomit (nauseous).  You vomit.  You have a fever.  You have belly pain that gets worse, and medicine does not help your pain. Summary  After the procedure, it is common to have a small amount of blood in your poop. You may also have mild cramping and bloating in your belly.  If you were given a medicine to help you relax (sedative) during your procedure, it can affect you for many hours. Do not drive or use machinery until your doctor says that it is safe.  Get help right away if you have a lot of blood in your poop, feel like you may vomit, have a fever, or have more belly pain. This information is not intended to replace advice given to you by your health care provider. Make sure you discuss any questions you have with your health care provider. Document Revised: 09/14/2019 Document Reviewed: 06/04/2019 Elsevier Patient Education  [place].  Colon Polyps  Colon polyps are tissue growths inside the colon, which is part of the large intestine. They are one of the types of polyps that  can grow in the body. A polyp may be a round bump or a mushroom-shaped growth. You could have one polyp or more than one. Most colon polyps are noncancerous (benign). However, some colon polyps can become cancerous over time. Finding and removing the polyps early can help prevent this. What are the causes? The exact cause of colon polyps is not known. What increases the risk? The following factors may make you more likely to develop this condition:  Having a family history of colorectal cancer or colon polyps.  Being older than 56 years of age.  Being younger than 56 years of age and having a significant family history of colorectal cancer or colon polyps or a genetic condition that puts you at higher risk of getting colon polyps.  Having inflammatory bowel disease, such as ulcerative colitis or Crohn's disease.  Having certain conditions passed from parent to child (  hereditary conditions), such as: ? Familial adenomatous polyposis (FAP). ? Lynch syndrome. ? Turcot syndrome. ? Peutz-Jeghers syndrome. ? MUTYH-associated polyposis (MAP).  Being overweight.  Certain lifestyle factors. These include smoking cigarettes, drinking too much alcohol, not getting enough exercise, and eating a diet that is high in fat and red meat and low in fiber.  Having had childhood cancer that was treated with radiation of the abdomen. What are the signs or symptoms? Many times, there are no symptoms. If you have symptoms, they may include:  Blood coming from the rectum during a bowel movement.  Blood in the stool (feces). The blood may be bright red or very dark in color.  Pain in the abdomen.  A change in bowel habits, such as constipation or diarrhea. How is this diagnosed? This condition is diagnosed with a colonoscopy. This is a procedure in which a lighted, flexible scope is inserted into the opening between the buttocks (anus) and then passed into the colon to examine the area. Polyps are  sometimes found when a colonoscopy is done as part of routine cancer screening tests. How is this treated? This condition is treated by removing any polyps that are found. Most polyps can be removed during a colonoscopy. Those polyps will then be tested for cancer. Additional treatment may be needed depending on the results of testing. Follow these instructions at home: Eating and drinking  Eat foods that are high in fiber, such as fruits, vegetables, and whole grains.  Eat foods that are high in calcium and vitamin D, such as milk, cheese, yogurt, eggs, liver, fish, and broccoli.  Limit foods that are high in fat, such as fried foods and desserts.  Limit the amount of red meat, precooked or cured meat, or other processed meat that you eat, such as hot dogs, sausages, bacon, or meat loaves.  Limit sugary drinks.   Lifestyle  Maintain a healthy weight, or lose weight if recommended by your health care provider.  Exercise every day or as told by your health care provider.  Do not use any products that contain nicotine or tobacco, such as cigarettes, e-cigarettes, and chewing tobacco. If you need help quitting, ask your health care provider.  Do not drink alcohol if: ? Your health care provider tells you not to drink. ? You are pregnant, may be pregnant, or are planning to become pregnant.  If you drink alcohol: ? Limit how much you use to:  0-1 drink a day for women.  0-2 drinks a day for men. ? Know how much alcohol is in your drink. In the [place] one drink equals one 12 oz bottle of beer (355 mL), one 5 oz glass of wine (148 mL), or one 1 oz glass of hard liquor (44 mL). General instructions  Take over-the-counter and prescription medicines only as told by your health care provider.  Keep all follow-up visits. This is important. This includes having regularly scheduled colonoscopies. Talk to your health care provider about when you need a colonoscopy. Contact a health care  provider if:  You have new or worsening bleeding during a bowel movement.  You have new or increased blood in your stool.  You have a change in bowel habits.  You lose weight for no known reason. Summary  Colon polyps are tissue growths inside the colon, which is part of the large intestine. They are one type of polyp that can grow in the body.  Most colon polyps are noncancerous (benign), but some can become  cancerous over time.  This condition is diagnosed with a colonoscopy.  This condition is treated by removing any polyps that are found. Most polyps can be removed during a colonoscopy. This information is not intended to replace advice given to you by your health care provider. Make sure you discuss any questions you have with your health care provider. Document Revised: 02/27/2020 Document Reviewed: 02/27/2020 Elsevier Patient Education  2021 [employer].

## 2021-01-20 NOTE — Op Note (Signed)
[facility] Patient Name: Steve Bernard Procedure Date: 01/20/2021 12:04 PM MRN: [id number] Date of Birth: 1966-01-16 Attending MD: [name] ,  CSN: [id number] Age: 55 Admit Type: Outpatient Procedure:                Upper GI endoscopy Indications:              Choking Providers:                [name], Gwenlyn Fudge, RN, Aram Candela Referring MD:              Medicines:                Monitored Anesthesia Care Complications:            No immediate complications. Estimated Blood Loss:     Estimated blood loss: none. Procedure:                Pre-Anesthesia Assessment:                           - Prior to the procedure, a History and Physical                            was performed, and patient medications, allergies                            and sensitivities were reviewed. The patient's                            tolerance of previous anesthesia was reviewed.                           - The risks and benefits of the procedure and the                            sedation options and risks were discussed with the                            patient. All questions were answered and informed                            consent was obtained.                           - ASA Grade Assessment: II - A patient with mild                            systemic disease.                           After obtaining informed consent, the endoscope was                            passed under direct vision. Throughout the                            procedure, the patient's blood pressure, pulse, and  oxygen saturations were monitored continuously. The                            GIF-H190 ([id number]) scope was introduced through the                            mouth, and advanced to the second part of duodenum.                            The upper GI endoscopy was accomplished without                            difficulty. The patient tolerated the procedure                             well. Scope In: 12:58:57 PM Scope Out: 1:14:24 PM Total Procedure Duration: 0 hours 15 minutes 27 seconds  Findings:      The upper third of the esophagus, middle third of the esophagus and       lower third of the esophagus were normal. Biopsies were obtained from       the proximal and distal esophagus with cold forceps for histology of       suspected eosinophilic esophagitis.      The esophagus and gastroesophageal junction were examined with white       light and narrow band imaging (NBI). There were esophageal mucosal       changes suspicious for short-segment Barrett's esophagus, classified as       Barrett's stage C0-M1 per Prague criteria. These changes involved the       mucosa extending to the Z-line (39 cm from the incisors). One tongue of       salmon-colored mucosa was present from 39 to 40 cm. The maximum       longitudinal extent of these esophageal mucosal changes was 1 cm in       length. This was biopsied with a cold forceps for histology.      A single 5 mm sessile polyp with no bleeding and stigmata of recent       bleeding was found in the gastric body. The polyp was removed with a hot       snare. Resection and retrieval were complete.      The examined duodenum was normal. Impression:               - Normal upper third of esophagus, middle third of                            esophagus and lower third of esophagus. Biopsied.                           - Esophageal mucosal changes suspicious for                            short-segment Barrett's esophagus, classified as                            Barrett's stage C0-M1 per Prague criteria. Biopsied.                           -  A single gastric polyp. Resected and retrieved.                           - Normal examined duodenum. Moderate Sedation:      Per Anesthesia Care Recommendation:           - Discharge patient to home (ambulatory).                           - Resume previous diet.                            - Await pathology results.                           - Follow the swallow therapist recommendations. Procedure Code(s):        --- Professional ---                           [phone], Esophagogastroduodenoscopy, flexible,                            transoral; with removal of tumor(s), polyp(s), or                            other lesion(s) by snare technique                           43239, 35, Esophagogastroduodenoscopy, flexible,                            transoral; with biopsy, single or multiple Diagnosis Code(s):        --- Professional ---                           K22.70, Barrett's esophagus without dysplasia                           K31.7, Polyp of stomach and duodenum CPT copyright 2019 American Medical Association. All rights reserved. The codes documented in this report are preliminary and upon coder review may  be revised to meet current compliance requirements. [name], MD [name],  01/20/2021 1:18:56 PM This report has been signed electronically. Number of Addenda: 0

## 2021-01-20 NOTE — Anesthesia Preprocedure Evaluation (Signed)
Anesthesia Evaluation  Patient identified by MRN, date of birth, ID band Patient awake    Reviewed: Allergy & Precautions, NPO status , Patient's Chart, lab work & pertinent test results  History of Anesthesia Complications Negative for: history of anesthetic complications  Airway Mallampati: III  TM Distance: >3 FB Neck ROM: Limited   Comment: Neck fusion Dental  (+) Teeth Intact, Dental Advisory Given   Pulmonary sleep apnea and Continuous Positive Airway Pressure Ventilation ,    Pulmonary exam normal breath sounds clear to auscultation       Cardiovascular Exercise Tolerance: Good hypertension, Pt. on medications Normal cardiovascular exam Rhythm:Regular Rate:Normal     Neuro/Psych negative neurological ROS     GI/Hepatic GERD  Medicated,  Endo/Other  Morbid obesity  Renal/GU      Musculoskeletal  (+) Arthritis  (gout),   Abdominal   Peds  Hematology   Anesthesia Other Findings vit  Reproductive/Obstetrics                             Anesthesia Physical Anesthesia Plan  ASA: III  Anesthesia Plan: General   Post-op Pain Management:    Induction: Intravenous  PONV Risk Score and Plan: TIVA  Airway Management Planned: Nasal Cannula, Natural Airway and Simple Face Mask  Additional Equipment:   Intra-op Plan:   Post-operative Plan:   Informed Consent: I have reviewed the patients History and Physical, chart, labs and discussed the procedure including the risks, benefits and alternatives for the proposed anesthesia with the patient or authorized representative who has indicated his/her understanding and acceptance.     Dental advisory given  Plan Discussed with: CRNA and Surgeon  Anesthesia Plan Comments:         Anesthesia Quick Evaluation

## 2021-01-20 NOTE — Anesthesia Postprocedure Evaluation (Signed)
Anesthesia Post Note  Patient: Steve Bernard  Procedure(s) Performed: COLONOSCOPY WITH PROPOFOL (N/A ) ESOPHAGOGASTRODUODENOSCOPY (EGD) WITH PROPOFOL (N/A ) BIOPSY POLYPECTOMY POLYPECTOMY INTESTINAL  Patient location during evaluation: Phase II Anesthesia Type: General Level of consciousness: awake and alert Pain management: satisfactory to patient Vital Signs Assessment: post-procedure vital signs reviewed and stable Respiratory status: spontaneous breathing and respiratory function stable Cardiovascular status: stable and blood pressure returned to baseline Postop Assessment: no apparent nausea or vomiting Anesthetic complications: no   No complications documented.   Last Vitals:  Vitals:   01/20/21 1152  BP: 126/90  Pulse: 77  Resp: 14  Temp: 36.8 C  SpO2: 97%    Last Pain:  Vitals:   01/20/21 1254  TempSrc:   PainSc: 0-No pain                 Karna Dupes

## 2021-01-20 NOTE — Interval H&P Note (Signed)
History and Physical Interval Note:  01/20/2021 12:09 PM  Steve Bernard is a 55 y.o. male with PMH GERD, HTN, gout, Menieres disease, DM, who presents for evaluation of choking episodes and colorectal cancer screening.  Patient states that he is still presenting intermittent episodes of choking with eating specific type of foods but he has not presented any acute for impaction.  Denies any heartburn, dysphagia or odynophagia.  States that the episodes of choking are intermittent and not present all the time.  The patient denies having any complaints such as melena, hematochezia, abdominal pain or distention, change in her bowel movement consistency or frequency, no changes in her weight recently.  No family history of colorectal cancer.   Last Colonoscopy: 2014 Normal mucosa of terminal ileum. Polyp at proximal transverse colon was ablated via cold biopsy and submitted together with a small polyp that was all stated from distal sigmoid colon.  Pathology consistent with a tubular adenoma and a sessile serrated lesion. 7 mm polyp snared from distal sigmoid colon another 5 mm polyp snared from rectosigmoid junction. These polyps were cemented together.  Pathology consistent with tubular adenoma and hyperplastic polyp Normal rectal mucosa and anal rectal junction.  Steve Bernard  has presented today for surgery, with the diagnosis of History of colon polyps Dysphagia.  The various methods of treatment have been discussed with the patient and family. After consideration of risks, benefits and other options for treatment, the patient has consented to  Procedure(s) with comments: COLONOSCOPY WITH PROPOFOL (N/A) - PM ESOPHAGOGASTRODUODENOSCOPY (EGD) WITH PROPOFOL (N/A) as a surgical intervention.  The patient's history has been reviewed, patient examined, no change in status, stable for surgery.  I have reviewed the patient's chart and labs.  Questions were answered to the patient's satisfaction.      [name] Mayorga

## 2021-01-22 LAB — SURGICAL PATHOLOGY

## 2021-01-23 ENCOUNTER — Encounter (INDEPENDENT_AMBULATORY_CARE_PROVIDER_SITE_OTHER): Payer: Self-pay | Admitting: *Deleted

## 2021-01-26 ENCOUNTER — Encounter (HOSPITAL_COMMUNITY): Payer: Self-pay | Admitting: Gastroenterology

## 2024-01-09 ENCOUNTER — Encounter (INDEPENDENT_AMBULATORY_CARE_PROVIDER_SITE_OTHER): Payer: Self-pay | Admitting: *Deleted

## 2024-05-10 ENCOUNTER — Telehealth: Payer: Self-pay

## 2024-05-10 NOTE — Telephone Encounter (Signed)
 LMOVM to call back

## 2024-05-10 NOTE — Telephone Encounter (Signed)
 Room 1, semaglutide per protocol, also needs to hold ashwagandha for one week Thanks

## 2024-05-10 NOTE — Telephone Encounter (Signed)
 Who is your primary care physician: Dr. Alvia Bernard  Reasons for the colonoscopy: Hx polyps  Have you had a colonoscopy before?  Yes 01-20-2021 [name]  Do you have family history of colon cancer? no  Previous colonoscopy with polyps removed? yes  Do you have a history colorectal cancer?   no  Are you diabetic? If yes, Type 1 or Type 2?    Yes type 2  Do you have a prosthetic or mechanical heart valve? no  Do you have a pacemaker/defibrillator?   no  Have you had endocarditis/atrial fibrillation? no  Have you had joint replacement within the last 12 months?  no  Do you tend to be constipated or have to use laxatives? no  Do you have any history of drugs or alchohol?  no  Do you use supplemental oxygen?  no  Have you had a stroke or heart attack within the last 6 months? no  Do you take weight loss medication?  no      Do you take any blood-thinning medications such as: (aspirin, warfarin, Plavix, Aggrenox)  no  If yes we need the name, milligram, dosage and who is prescribing doctor  Current Outpatient Medications on File Prior to Visit  Medication Sig Dispense Refill   allopurinol  (ZYLOPRIM ) 300 MG tablet Take 1 tablet (300 mg total) by mouth daily. 90 tablet 1   Ascorbic Acid (VITAMIN C) 500 MG CAPS Take 500 mg by mouth. Patient reports that he takes 3 times a week. (Patient not taking: No sig reported)     atorvastatin (LIPITOR) 40 MG tablet Take 40 mg by mouth at bedtime.     chlorthalidone (HYGROTON) 25 MG tablet Take 25 mg by mouth See admin instructions. Take 25 mg by mouth Sunday , Monday, Tuesday, Thursday, Saturday.  2   colchicine  0.6 MG tablet Take 1 tablet (0.6 mg total) by mouth daily. (Patient taking differently: Take 0.6 mg by mouth daily as needed (gout).) 30 tablet 2   diazepam (VALIUM) 2 MG tablet Take 2 mg by mouth every 8 (eight) hours as needed (vertigo).  0   esomeprazole (NEXIUM) 40 MG capsule Take 40 mg by mouth daily before breakfast.      fluticasone (FLONASE) 50 MCG/ACT nasal spray Place 2 sprays into the nose daily as needed for allergies.     IRON PO Take by mouth. (Patient not taking: No sig reported)     Magnesium 250 MG TABS Take 250 mg by mouth. Patient reports that he takes by mouth 3 times a week, (Patient not taking: No sig reported)     metFORMIN (GLUCOPHAGE) 500 MG tablet Take 500 mg by mouth See admin instructions. Take 500 mg by mouth in the morning and 1000 mg at bedtime  2   metoprolol (LOPRESSOR) 50 MG tablet Take 50 mg by mouth 2 (two) times daily.     Multiple Vitamin (MULTI-DAY PO) Take 1 tablet by mouth daily.     Multiple Vitamins-Minerals (ZINC PO) Take 50 mg by mouth. Patient reports that he takes 4 times a week. (Patient not taking: No sig reported)     naphazoline-pheniramine (NAPHCON-A) 0.025-0.3 % ophthalmic solution Place 1 drop into both eyes 4 (four) times daily as needed (Itchy Eyes). (Patient not taking: Reported on 01/13/2021)     ondansetron (ZOFRAN-ODT) 4 MG disintegrating tablet Take 4 mg by mouth daily as needed. (Patient not taking: No sig reported)     Semaglutide,0.25 or 0.5MG /DOS, 2 MG/1.5ML SOPN Inject 0.25 mg  into the skin once a week.     tiZANidine (ZANAFLEX) 4 MG tablet Take 4 mg by mouth. (Patient not taking: No sig reported)     valsartan (DIOVAN) 80 MG tablet Take 80 mg by mouth daily.     No current facility-administered medications on file prior to visit.    Allergies  Allergen Reactions   Lisinopril Cough     Pharmacy: [name]   Primary Insurance Name: Steve Bernard [id number]  Best number where you can be reached: [phone]

## 2024-05-11 ENCOUNTER — Other Ambulatory Visit: Payer: Self-pay | Admitting: *Deleted

## 2024-05-11 ENCOUNTER — Encounter: Payer: Self-pay | Admitting: *Deleted

## 2024-05-11 MED ORDER — NA SULFATE-K SULFATE-MG SULF 17.5-3.13-1.6 GM/177ML PO SOLN: ORAL | 0 refills | Status: AC

## 2024-05-11 NOTE — Telephone Encounter (Signed)
 Pt has been scheduled for 05/23/24. Instructions sent via mychart and prep sent to pharmacy.

## 2024-05-15 NOTE — Telephone Encounter (Signed)
 Questionnaire from recall, no referral needed

## 2024-05-23 ENCOUNTER — Ambulatory Visit (HOSPITAL_COMMUNITY)
Admission: RE | Admit: 2024-05-23 | Discharge: 2024-05-23 | Disposition: A | Discharge status: Home / Self Care | Attending: Gastroenterology | Admitting: Gastroenterology

## 2024-05-23 ENCOUNTER — Encounter (HOSPITAL_COMMUNITY)
Admission: RE | Disposition: A | Discharge status: Home / Self Care | Payer: Self-pay | Source: Home / Self Care | Attending: Gastroenterology

## 2024-05-23 ENCOUNTER — Other Ambulatory Visit: Payer: Self-pay

## 2024-05-23 ENCOUNTER — Ambulatory Visit (HOSPITAL_COMMUNITY)

## 2024-05-23 ENCOUNTER — Encounter (INDEPENDENT_AMBULATORY_CARE_PROVIDER_SITE_OTHER): Payer: Self-pay | Admitting: *Deleted

## 2024-05-23 ENCOUNTER — Encounter (HOSPITAL_COMMUNITY): Payer: Self-pay | Admitting: Gastroenterology

## 2024-05-23 DIAGNOSIS — D123 Benign neoplasm of transverse colon: Secondary | ICD-10-CM

## 2024-05-23 DIAGNOSIS — Z1211 Encounter for screening for malignant neoplasm of colon: Secondary | ICD-10-CM | POA: Diagnosis not present

## 2024-05-23 DIAGNOSIS — Z860100 Personal history of colon polyps, unspecified: Secondary | ICD-10-CM

## 2024-05-23 DIAGNOSIS — K219 Gastro-esophageal reflux disease without esophagitis: Secondary | ICD-10-CM | POA: Diagnosis not present

## 2024-05-23 DIAGNOSIS — K621 Rectal polyp: Secondary | ICD-10-CM | POA: Diagnosis not present

## 2024-05-23 DIAGNOSIS — D128 Benign neoplasm of rectum: Secondary | ICD-10-CM | POA: Diagnosis not present

## 2024-05-23 DIAGNOSIS — I10 Essential (primary) hypertension: Secondary | ICD-10-CM | POA: Diagnosis not present

## 2024-05-23 DIAGNOSIS — Z7984 Long term (current) use of oral hypoglycemic drugs: Secondary | ICD-10-CM | POA: Insufficient documentation

## 2024-05-23 DIAGNOSIS — E119 Type 2 diabetes mellitus without complications: Secondary | ICD-10-CM | POA: Diagnosis not present

## 2024-05-23 DIAGNOSIS — Z7985 Long-term (current) use of injectable non-insulin antidiabetic drugs: Secondary | ICD-10-CM | POA: Diagnosis not present

## 2024-05-23 DIAGNOSIS — K648 Other hemorrhoids: Secondary | ICD-10-CM | POA: Insufficient documentation

## 2024-05-23 DIAGNOSIS — Z09 Encounter for follow-up examination after completed treatment for conditions other than malignant neoplasm: Secondary | ICD-10-CM | POA: Diagnosis present

## 2024-05-23 HISTORY — PX: COLONOSCOPY: SHX5424

## 2024-05-23 HISTORY — DX: Type 2 diabetes mellitus without complications: E11.9

## 2024-05-23 LAB — HM COLONOSCOPY

## 2024-05-23 LAB — GLUCOSE, CAPILLARY: Glucose-Capillary: 97 mg/dL (ref 70–99)

## 2024-05-23 SURGERY — COLONOSCOPY
Anesthesia: General

## 2024-05-23 MED ORDER — PROPOFOL 10 MG/ML IV BOLUS
INTRAVENOUS | Status: DC | PRN
  Administered 2024-05-23: 80 mg via INTRAVENOUS
  Administered 2024-05-23: 100 ug/kg/min via INTRAVENOUS

## 2024-05-23 MED ORDER — LACTATED RINGERS IV SOLN: INTRAVENOUS | Status: DC

## 2024-05-23 MED ORDER — PHENYLEPHRINE 80 MCG/ML (10ML) SYRINGE FOR IV PUSH (FOR BLOOD PRESSURE SUPPORT)
PREFILLED_SYRINGE | INTRAVENOUS | Status: DC | PRN
  Administered 2024-05-23: 80 ug via INTRAVENOUS

## 2024-05-23 MED ORDER — LACTATED RINGERS IV SOLN: INTRAVENOUS | Status: DC | PRN

## 2024-05-23 NOTE — Transfer of Care (Signed)
 Immediate Anesthesia Transfer of Care Note  Patient: Steve Bernard  Procedure(s) Performed: COLONOSCOPY  Patient Location: PACU  Anesthesia Type:General  Level of Consciousness: awake, alert , and patient cooperative  Airway & Oxygen Therapy: Patient Spontanous Breathing  Post-op Assessment: Report given to RN, Post -op Vital signs reviewed and stable, and Patient moving all extremities X 4  Post vital signs: Reviewed and stable  Last Vitals:  Vitals Value Taken Time  BP 118/70 05/23/24 10:26  Temp 36.5 C 05/23/24 10:26  Pulse 75 05/23/24 10:26  Resp 10 05/23/24 10:26  SpO2 97 % 05/23/24 10:26    Last Pain:  Vitals:   05/23/24 1026  TempSrc: Oral  PainSc:       Patients Stated Pain Goal: 5 (05/23/24 0847)  Complications: No notable events documented.

## 2024-05-23 NOTE — Anesthesia Preprocedure Evaluation (Addendum)
 Anesthesia Evaluation  Patient identified by MRN, date of birth, ID band Patient awake    Reviewed: Allergy & Precautions, H&P , NPO status , Patient's Chart, lab work & pertinent test results  Airway Mallampati: II  TM Distance: >3 FB Neck ROM: Full    Dental no notable dental hx.    Pulmonary neg pulmonary ROS   Pulmonary exam normal breath sounds clear to auscultation       Cardiovascular hypertension, Normal cardiovascular exam Rhythm:Regular Rate:Normal     Neuro/Psych negative neurological ROS  negative psych ROS   GI/Hepatic negative GI ROS, Neg liver ROS,,,  Endo/Other  diabetes    Renal/GU negative Renal ROS  negative genitourinary   Musculoskeletal negative musculoskeletal ROS (+)    Abdominal   Peds negative pediatric ROS (+)  Hematology negative hematology ROS (+)   Anesthesia Other Findings   Reproductive/Obstetrics negative OB ROS                              Anesthesia Physical Anesthesia Plan  ASA: 2  Anesthesia Plan: General   Post-op Pain Management:    Induction: Intravenous  PONV Risk Score and Plan: Propofol infusion  Airway Management Planned: Nasal Cannula  Additional Equipment:   Intra-op Plan:   Post-operative Plan:   Informed Consent: I have reviewed the patients History and Physical, chart, labs and discussed the procedure including the risks, benefits and alternatives for the proposed anesthesia with the patient or authorized representative who has indicated his/her understanding and acceptance.     Dental advisory given  Plan Discussed with: CRNA  Anesthesia Plan Comments:          Anesthesia Quick Evaluation

## 2024-05-23 NOTE — Op Note (Signed)
 [facility] Patient Name: Steve Bernard Procedure Date: 05/23/2024 9:43 AM MRN: [id number] Date of Birth: 1966-10-07 Attending MD: Toribio Fortune , , [id number] CSN: [id number] Age: 58 Admit Type: Outpatient Procedure:                Colonoscopy Indications:              Surveillance: Personal history of adenomatous                            polyps on last colonoscopy 3 years ago Providers:                Toribio Fortune, Harlene Lips, Ashley Goins,                            Italy Wilson, Technician Referring MD:              Medicines:                Monitored Anesthesia Care Complications:            No immediate complications. Estimated Blood Loss:     Estimated blood loss: none. Procedure:                Pre-Anesthesia Assessment:                           - Prior to the procedure, a History and Physical                            was performed, and patient medications, allergies                            and sensitivities were reviewed. The patient's                            tolerance of previous anesthesia was reviewed.                           - The risks and benefits of the procedure and the                            sedation options and risks were discussed with the                            patient. All questions were answered and informed                            consent was obtained.                           - ASA Grade Assessment: II - A patient with mild                            systemic disease.                           After obtaining informed consent, the colonoscope  was passed under direct vision. Throughout the                            procedure, the patient's blood pressure, pulse, and                            oxygen saturations were monitored continuously. The                            [id number]-HQ190L ([id number]) scope was introduced through                            the anus and advanced to the the cecum,  identified                            by appendiceal orifice and ileocecal valve. The                            colonoscopy was performed without difficulty. The                            patient tolerated the procedure well. The quality                            of the bowel preparation was adequate. Scope In: 9:55:16 AM Scope Out: 10:22:50 AM Scope Withdrawal Time: 0 hours 21 minutes 54 seconds  Total Procedure Duration: 0 hours 27 minutes 34 seconds  Findings:      The perianal and digital rectal examinations were normal.      Three sessile polyps were found in the transverse colon. The polyps were       3 to 4 mm in size. These polyps were removed with a cold snare.       Resection and retrieval were complete.      Two sessile polyps were found in the rectum. The polyps were 2 to 3 mm       in size. These polyps were removed with a cold snare. Resection and       retrieval were complete.      Non-bleeding internal hemorrhoids were found during retroflexion. The       hemorrhoids were small. Impression:               - Three 3 to 4 mm polyps in the transverse colon,                            removed with a cold snare. Resected and retrieved.                           - Two 2 to 3 mm polyps in the rectum, removed with                            a cold snare. Resected and retrieved.                           - Non-bleeding internal hemorrhoids. Moderate  Sedation:      Per Anesthesia Care Recommendation:           - Discharge patient to home (ambulatory).                           - Resume previous diet.                           - Await pathology results.                           - Repeat colonoscopy for surveillance based on                            pathology results. Procedure Code(s):        --- Professional ---                           [phone], Colonoscopy, flexible; with removal of                            tumor(s), polyp(s), or other lesion(s) by snare                             technique Diagnosis Code(s):        --- Professional ---                           Z86.010, Personal history of colonic polyps                           D12.3, Benign neoplasm of transverse colon (hepatic                            flexure or splenic flexure)                           D12.8, Benign neoplasm of rectum                           K64.8, Other hemorrhoids CPT copyright 2022 [employer]. All rights reserved. The codes documented in this report are preliminary and upon coder review may  be revised to meet current compliance requirements. Toribio Fortune, MD Toribio Fortune,  05/23/2024 10:32:51 AM This report has been signed electronically. Number of Addenda: 0

## 2024-05-23 NOTE — Discharge Instructions (Addendum)
 You are being discharged to home.  Resume your previous diet.  We are waiting for your pathology results.  Your physician has recommended a repeat colonoscopy for surveillance based on pathology results.

## 2024-05-23 NOTE — Anesthesia Postprocedure Evaluation (Signed)
 Anesthesia Post Note  Patient: Steve Bernard  Procedure(s) Performed: COLONOSCOPY  Patient location during evaluation: PACU Anesthesia Type: General Level of consciousness: awake and alert Pain management: pain level controlled Vital Signs Assessment: post-procedure vital signs reviewed and stable Respiratory status: spontaneous breathing, nonlabored ventilation, respiratory function stable and patient connected to nasal cannula oxygen Cardiovascular status: stable and blood pressure returned to baseline Postop Assessment: no apparent nausea or vomiting Anesthetic complications: no   No notable events documented.   Last Vitals:  Vitals:   05/23/24 0847 05/23/24 1026  BP:  118/70  Pulse: 71 75  Resp: 20 10  Temp: 36.9 C 36.5 C  SpO2: 98% 97%    Last Pain:  Vitals:   05/23/24 1026  TempSrc: Oral  PainSc: 0-No pain                 Andrea Limes

## 2024-05-23 NOTE — H&P (Signed)
 Steve Bernard is an 58 y.o. male.   Chief Complaint: History of colon polyps HPI: 58 y/o M with PMH DM, GERD, gout, HTN, Mnire disease, coming for history of colon polyps.  Last colonoscopy in 2022, had 5 tubular adenomas removed.  The patient denies having any complaints such as melena, hematochezia, abdominal pain or distention, change in her bowel movement consistency or frequency, no changes in weight recently.  No family history of colorectal cancer.  Past Medical History:  Diagnosis Date   Diabetes (HCC)    GERD (gastroesophageal reflux disease)    Gout    Hypertension    Meniere disease 2017   Vertigo 2017    Past Surgical History:  Procedure Laterality Date   ANTERIOR FUSION CERVICAL SPINE N/A 03/2015   BIOPSY  01/20/2021   Procedure: BIOPSY;  Surgeon: Eartha Angelia Sieving, MD;  Location: [facility];  Service: Gastroenterology;;  gastric GEJ esophagus   CHOLECYSTECTOMY     COLONOSCOPY N/A 03/21/2013   Procedure: COLONOSCOPY;  Surgeon: Claudis RAYMOND Rivet, MD;  Location: [facility];  Service: Endoscopy;  Laterality: N/A;  955-rescheduled to 7:30am Ann notified pt   COLONOSCOPY WITH PROPOFOL  N/A 01/20/2021   Procedure: COLONOSCOPY WITH PROPOFOL ;  Surgeon: Eartha Angelia Sieving, MD;  Location: [facility];  Service: Gastroenterology;  Laterality: N/A;  PM   ESOPHAGOGASTRODUODENOSCOPY (EGD) WITH PROPOFOL  N/A 01/20/2021   Procedure: ESOPHAGOGASTRODUODENOSCOPY (EGD) WITH PROPOFOL ;  Surgeon: Eartha Angelia Sieving, MD;  Location: [facility];  Service: Gastroenterology;  Laterality: N/A;   POLYPECTOMY  01/20/2021   Procedure: POLYPECTOMY;  Surgeon: Eartha Angelia Sieving, MD;  Location: [facility];  Service: Gastroenterology;;  gastric   POLYPECTOMY  01/20/2021   Procedure: POLYPECTOMY INTESTINAL;  Surgeon: Eartha Angelia Sieving, MD;  Location: [facility];  Service: Gastroenterology;;  colon   SHOULDER ARTHROSCOPY Right    TONSILLECTOMY       Family History  Problem Relation Age of Onset   Arthritis/Rheumatoid Mother    Diabetes Mother    Heart disease Father    Arthritis/Rheumatoid Maternal Aunt    Social History:  reports that he has never smoked. He has never used smokeless tobacco. He reports that he does not drink alcohol and does not use drugs.  Allergies:  Allergies  Allergen Reactions   Lisinopril Cough    Medications Prior to Admission  Medication Sig Dispense Refill   atorvastatin (LIPITOR) 40 MG tablet Take 40 mg by mouth at bedtime.     chlorthalidone (HYGROTON) 25 MG tablet Take 25 mg by mouth See admin instructions. Take 25 mg by mouth Sunday , Monday, Tuesday, Thursday, Saturday.  2   [employer] ASHWAGANDHA PO Take by mouth.     empagliflozin (JARDIANCE) 10 MG TABS tablet Take 10 mg by mouth daily.     esomeprazole (NEXIUM) 40 MG capsule Take 40 mg by mouth daily before breakfast.     fexofenadine (ALLEGRA) 180 MG tablet Take 180 mg by mouth daily.     fluticasone (FLONASE) 50 MCG/ACT nasal spray Place 2 sprays into the nose daily as needed for allergies.     GINSENG PO Take by mouth.     latanoprost (XALATAN) 0.005 % ophthalmic solution 1 drop at bedtime.     Magnesium 250 MG TABS Take 250 mg by mouth. Patient reports that he takes by mouth 3 times a week,     metFORMIN (GLUCOPHAGE) 500 MG tablet Take 500 mg by mouth See admin instructions. Take 500 mg by  mouth in the morning and 1000 mg at bedtime  2   Multiple Vitamin (MULTI-DAY PO) Take 1 tablet by mouth daily.     Na Sulfate-K Sulfate-Mg Sulfate concentrate (SUPREP) 17.5-3.13-1.6 GM/177ML SOLN As directed 354 mL 0   naphazoline-pheniramine (NAPHCON-A) 0.025-0.3 % ophthalmic solution Place 1 drop into both eyes 4 (four) times daily as needed (Itchy Eyes).     ondansetron (ZOFRAN-ODT) 4 MG disintegrating tablet Take 4 mg by mouth daily as needed.     tiZANidine (ZANAFLEX) 4 MG tablet Take 4 mg by mouth.     valsartan (DIOVAN) 80 MG tablet Take 80 mg by  mouth daily.     allopurinol  (ZYLOPRIM ) 300 MG tablet Take 1 tablet (300 mg total) by mouth daily. 90 tablet 1   diazepam (VALIUM) 2 MG tablet Take 2 mg by mouth every 8 (eight) hours as needed (vertigo).  0   Semaglutide,0.25 or 0.5MG /DOS, 2 MG/1.5ML SOPN Inject 0.25 mg into the skin once a week.      No results found for this or any previous visit (from the past 48 hours). No results found.  Review of Systems  All other systems reviewed and are negative.   Pulse 71, temperature 98.5 F (36.9 C), temperature source Oral, resp. rate 20, height 5' 11 (1.803 m), weight 102.1 kg, SpO2 98%. Physical Exam  GENERAL: The patient is AO x3, in no acute distress. HEENT: Head is normocephalic and atraumatic. EOMI are intact. Mouth is well hydrated and without lesions. NECK: Supple. No masses LUNGS: Clear to auscultation. No presence of rhonchi/wheezing/rales. Adequate chest expansion HEART: RRR, normal s1 and s2. ABDOMEN: Soft, nontender, no guarding, no peritoneal signs, and nondistended. BS +. No masses. EXTREMITIES: Without any cyanosis, clubbing, rash, lesions or edema. NEUROLOGIC: AOx3, no focal motor deficit. SKIN: no jaundice, no rashes  Assessment/Plan 58 y/o M with PMH DM, GERD, gout, HTN, Mnire disease, coming for history of colon polyps.  Will proceed with colonoscopy.  Toribio Eartha Flavors, MD 05/23/2024, 9:02 AM

## 2024-05-24 ENCOUNTER — Encounter (HOSPITAL_COMMUNITY): Payer: Self-pay | Admitting: Gastroenterology

## 2024-05-28 ENCOUNTER — Ambulatory Visit (INDEPENDENT_AMBULATORY_CARE_PROVIDER_SITE_OTHER): Payer: Self-pay | Admitting: Gastroenterology

## 2024-05-28 LAB — SURGICAL PATHOLOGY

## 2024-05-29 NOTE — Progress Notes (Signed)
 5 yr TCS noted in recall Patient result letter mailed procedure note and pathology result faxed to PCP
